# Patient Record
Sex: Female | Born: 1975 | Race: Black or African American | Hispanic: No | Smoking: Former smoker
Health system: Southern US, Community
[De-identification: ages and names within clinical notes are randomized; demographics above are authoritative.]

## PROBLEM LIST (undated history)

## (undated) DIAGNOSIS — R112 Nausea with vomiting, unspecified: Secondary | ICD-10-CM

## (undated) DIAGNOSIS — J189 Pneumonia, unspecified organism: Secondary | ICD-10-CM

## (undated) DIAGNOSIS — M1990 Unspecified osteoarthritis, unspecified site: Secondary | ICD-10-CM

## (undated) DIAGNOSIS — F419 Anxiety disorder, unspecified: Secondary | ICD-10-CM

## (undated) DIAGNOSIS — K219 Gastro-esophageal reflux disease without esophagitis: Secondary | ICD-10-CM

## (undated) DIAGNOSIS — J45909 Unspecified asthma, uncomplicated: Secondary | ICD-10-CM

## (undated) DIAGNOSIS — Z9889 Other specified postprocedural states: Secondary | ICD-10-CM

## (undated) DIAGNOSIS — M419 Scoliosis, unspecified: Secondary | ICD-10-CM

## (undated) HISTORY — PX: CHOLECYSTECTOMY: SHX55

## (undated) HISTORY — PX: APPENDECTOMY: SHX54

## (undated) HISTORY — PX: ABDOMINAL HYSTERECTOMY: SHX81

## (undated) HISTORY — PX: HERNIA REPAIR: SHX51

## (undated) HISTORY — PX: ABDOMINOPLASTY: SUR9

---

## 2011-02-03 HISTORY — PX: ABDOMINAL HYSTERECTOMY: SHX81

## 2014-10-07 ENCOUNTER — Encounter (HOSPITAL_COMMUNITY): Payer: Self-pay | Admitting: *Deleted

## 2014-10-07 ENCOUNTER — Emergency Department (HOSPITAL_COMMUNITY)
Admission: EM | Admit: 2014-10-07 | Discharge: 2014-10-07 | Disposition: A | Discharge status: Home / Self Care | Payer: Self-pay | Attending: Emergency Medicine | Admitting: Emergency Medicine

## 2014-10-07 DIAGNOSIS — Z8659 Personal history of other mental and behavioral disorders: Secondary | ICD-10-CM | POA: Insufficient documentation

## 2014-10-07 DIAGNOSIS — F419 Anxiety disorder, unspecified: Secondary | ICD-10-CM | POA: Insufficient documentation

## 2014-10-07 DIAGNOSIS — Z8739 Personal history of other diseases of the musculoskeletal system and connective tissue: Secondary | ICD-10-CM | POA: Insufficient documentation

## 2014-10-07 DIAGNOSIS — H9209 Otalgia, unspecified ear: Secondary | ICD-10-CM | POA: Insufficient documentation

## 2014-10-07 DIAGNOSIS — K029 Dental caries, unspecified: Secondary | ICD-10-CM | POA: Insufficient documentation

## 2014-10-07 DIAGNOSIS — K002 Abnormalities of size and form of teeth: Secondary | ICD-10-CM | POA: Insufficient documentation

## 2014-10-07 DIAGNOSIS — Z720 Tobacco use: Secondary | ICD-10-CM | POA: Insufficient documentation

## 2014-10-07 DIAGNOSIS — R51 Headache: Secondary | ICD-10-CM | POA: Insufficient documentation

## 2014-10-07 HISTORY — DX: Anxiety disorder, unspecified: F41.9

## 2014-10-07 HISTORY — DX: Unspecified osteoarthritis, unspecified site: M19.90

## 2014-10-07 MED ORDER — BUPIVACAINE-EPINEPHRINE (PF) 0.5% -1:200000 IJ SOLN
1.8000 mL | Freq: Once | INTRAMUSCULAR | Status: AC
  Administered 2014-10-07: 1.8 mL

## 2014-10-07 MED ORDER — PENICILLIN V POTASSIUM 500 MG PO TABS: 500.0000 mg | ORAL_TABLET | Freq: Three times a day (TID) | ORAL | Status: DC

## 2014-10-07 NOTE — ED Provider Notes (Signed)
CSN: [id number]     Arrival date & time 10/07/14  5027 History  This chart was scribed for non-physician practitioner, Abigail Butts, PA-C, working with Orlie Dakin, MD by Evelene Croon, ED Scribe. This patient was seen in room TR05C/TR05C and the patient's care was started at 9:30 AM.  Chief Complaint  Patient presents with  . Dental Pain    The history is provided by the patient. No language interpreter was used.     HPI Comments:  Sara Pratt is a 39 y.o. female who presents to the Emergency Department complaining of constant left upper dental pain for 4 days.  Pt notes the filling has come out and the tooth has chipped. She reports associated left facial pain and mild left sided facial swelling, ear pain and HA. She has taken aleve, BC powder, motrin without relief. NO fever, chills, nausea, vomiting,    Past Medical History  Diagnosis Date  . Arthritis   . Anxiety    Past Surgical History  Procedure Laterality Date  . Abdominal hysterectomy     History reviewed. No pertinent family history. Social History  Substance Use Topics  . Smoking status: Current Some Day Smoker    Types: Cigarettes  . Smokeless tobacco: Never Used  . Alcohol Use: Yes     Comment: social   OB History    No data available     Review of Systems  Constitutional: Negative for fever, chills, diaphoresis, appetite change, fatigue and unexpected weight change.  HENT: Positive for dental problem, ear pain and facial swelling. Negative for mouth sores.   Eyes: Negative for visual disturbance.  Respiratory: Negative for cough, chest tightness, shortness of breath and wheezing.   Cardiovascular: Negative for chest pain.  Gastrointestinal: Negative for nausea, vomiting, abdominal pain, diarrhea and constipation.  Endocrine: Negative for polydipsia, polyphagia and polyuria.  Genitourinary: Negative for dysuria, urgency, frequency and hematuria.  Musculoskeletal: Negative for back pain and neck  stiffness.  Skin: Negative for rash.  Allergic/Immunologic: Negative for immunocompromised state.  Neurological: Positive for headaches. Negative for syncope and light-headedness.  Hematological: Does not bruise/bleed easily.  Psychiatric/Behavioral: Negative for sleep disturbance. The patient is not nervous/anxious.       Allergies  Review of patient's allergies indicates not on file.  Home Medications   Prior to Admission medications   Medication Sig Start Date End Date Taking? Authorizing Provider  penicillin v potassium (VEETID) 500 MG tablet Take 1 tablet (500 mg total) by mouth 3 (three) times daily. 10/07/14   Daurice Ovando, PA-C   BP 144/84 mmHg  Pulse 58  Temp(Src) 98.1 F (36.7 C) (Oral)  Resp 18  Ht 5' 4.5" (1.638 m)  Wt 150 lb (68.04 kg)  BMI 25.36 kg/m2  SpO2 100% Physical Exam  Constitutional: She appears well-developed and well-nourished.  HENT:  Head: Normocephalic.  Right Ear: Tympanic membrane, external ear and ear canal normal.  Left Ear: Tympanic membrane, external ear and ear canal normal.  Nose: Nose normal. Right sinus exhibits no maxillary sinus tenderness and no frontal sinus tenderness. Left sinus exhibits no maxillary sinus tenderness and no frontal sinus tenderness.  Mouth/Throat: Uvula is midline, oropharynx is clear and moist and mucous membranes are normal. No oral lesions. Abnormal dentition. Dental caries present. No uvula swelling or lacerations. No oropharyngeal exudate, posterior oropharyngeal edema, posterior oropharyngeal erythema or tonsillar abscesses.  No gingival swelling, fluctuance or induration No gross abscess No facial swelling TTP of Tooth #14 Many other teeth of th mouth  are surgically absent  Eyes: Conjunctivae are normal. Pupils are equal, round, and reactive to light. Right eye exhibits no discharge. Left eye exhibits no discharge.  Neck: Normal range of motion. Neck supple.  No stridor Handling secretions without  difficulty No nuchal rigidity No cervical lymphadenopathy   Cardiovascular: Normal rate, regular rhythm and normal heart sounds.   Pulmonary/Chest: Effort normal. No respiratory distress.  Equal chest rise  Abdominal: Soft. Bowel sounds are normal. She exhibits no distension. There is no tenderness.  Lymphadenopathy:    She has no cervical adenopathy.  Neurological: She is alert.  Skin: Skin is warm and dry.  Psychiatric: She has a normal mood and affect.  Nursing note and vitals reviewed.   ED Course  Dental Date/Time: 10/07/2014 9:53 AM Performed by: Abigail Butts Authorized by: Abigail Butts Consent: Verbal consent obtained. Risks and benefits: risks, benefits and alternatives were discussed Consent given by: patient Patient understanding: patient states understanding of the procedure being performed Patient consent: the patient's understanding of the procedure matches consent given Procedure consent: procedure consent matches procedure scheduled Relevant documents: relevant documents present and verified Site marked: the operative site was marked Required items: required blood products, implants, devices, and special equipment available Patient identity confirmed: arm band and verbally with patient Time out: Immediately prior to procedure a "time out" was called to verify the correct patient, procedure, equipment, support staff and site/side marked as required. Preparation: Patient was prepped and draped in the usual sterile fashion. Local anesthesia used: yes Anesthesia: local infiltration Local anesthetic: bupivacaine 0.5% with epinephrine Anesthetic total: 1.8 ml Patient sedated: no Patient tolerance: Patient tolerated the procedure well with no immediate complications Comments: Dental block of tooth #14 with complete pain relief     DIAGNOSTIC STUDIES:  Oxygen Saturation is 100% on RA, normal by my interpretation.    COORDINATION OF CARE:  9:33 AM  Will perform dental block and discharge with antibiotics and referral to dentist. Discussed treatment plan with pt at bedside and pt agreed to plan.  Labs Review Labs Reviewed - No data to display  Imaging Review No results found. I have personally reviewed and evaluated these images and lab results as part of my medical decision-making.   EKG Interpretation None      MDM   Final diagnoses:  Pain due to dental caries   Prudencio Burly presents with dental pain.  No gross abscess.  Exam unconcerning for Ludwig's angina or spread of infection.  Pt given dental block in the ED.  Will treat with penicillin and recommend ibuprofen usage.  Urged patient to follow-up with dentist.     BP 144/84 mmHg  Pulse 58  Temp(Src) 98.1 F (36.7 C) (Oral)  Resp 18  Ht 5' 4.5" (1.638 m)  Wt 150 lb (68.04 kg)  BMI 25.36 kg/m2  SpO2 100%  I personally performed the services described in this documentation, which was scribed in my presence. The recorded information has been reviewed and is accurate.   Jarrett Soho Bryonna Sundby, PA-C 10/07/14 2500  Orlie Dakin, MD 10/07/14 [phone]

## 2014-10-07 NOTE — ED Notes (Signed)
Pt reports dental pain ,facial pain and swelling . Pt reports LT ear pain.

## 2014-10-07 NOTE — Discharge Instructions (Signed)
1. Medications: ibuprofen for pain, penicillin, usual home medications 2. Treatment: rest, drink plenty of fluids, take medications as prescribed 3. Follow Up: Please followup with dentistry within 1 week for discussion of your diagnoses and further evaluation after today's visit; if you do not have a primary care doctor use the resource guide provided to find one; Return to the ER for high fevers, difficulty breathing, difficulty swallowing or other concerning symptoms    Dental Caries Dental caries (also called tooth decay) is the most common oral disease. It can occur at any age but is more common in children and young adults.  HOW DENTAL CARIES DEVELOPS  The process of decay begins when bacteria and foods (particularly sugars and starches) combine in your mouth to produce plaque. Plaque is a substance that sticks to the hard, outer surface of a tooth (enamel). The bacteria in plaque produce acids that attack enamel. These acids may also attack the root surface of a tooth (cementum) if it is exposed. Repeated attacks dissolve these surfaces and create holes in the tooth (cavities). If left untreated, the acids destroy the other layers of the tooth.  RISK FACTORS  Frequent sipping of sugary beverages.   Frequent snacking on sugary and starchy foods, especially those that easily get stuck in the teeth.   Poor oral hygiene.   Dry mouth.   Substance abuse such as methamphetamine abuse.   Broken or poor-fitting dental restorations.   Eating disorders.   Gastroesophageal reflux disease (GERD).   Certain radiation treatments to the head and neck. SYMPTOMS In the early stages of dental caries, symptoms are seldom present. Sometimes white, chalky areas may be seen on the enamel or other tooth layers. In later stages, symptoms may include:  Pits and holes on the enamel.  Toothache after sweet, hot, or cold foods or drinks are consumed.  Pain around the tooth.  Swelling around  the tooth. DIAGNOSIS  Most of the time, dental caries is detected during a regular dental checkup. A diagnosis is made after a thorough medical and dental history is taken and the surfaces of your teeth are checked for signs of dental caries. Sometimes special instruments, such as lasers, are used to check for dental caries. Dental X-ray exams may be taken so that areas not visible to the eye (such as between the contact areas of the teeth) can be checked for cavities.  TREATMENT  If dental caries is in its early stages, it may be reversed with a fluoride treatment or an application of a remineralizing agent at the dental office. Thorough brushing and flossing at home is needed to aid these treatments. If it is in its later stages, treatment depends on the location and extent of tooth destruction:   If a small area of the tooth has been destroyed, the destroyed area will be removed and cavities will be filled with a material such as gold, silver amalgam, or composite resin.   If a large area of the tooth has been destroyed, the destroyed area will be removed and a cap (crown) will be fitted over the remaining tooth structure.   If the center part of the tooth (pulp) is affected, a procedure called a root canal will be needed before a filling or crown can be placed.   If most of the tooth has been destroyed, the tooth may need to be pulled (extracted). HOME CARE INSTRUCTIONS You can prevent, stop, or reverse dental caries at home by practicing good oral hygiene. Good oral  hygiene includes:  Thoroughly cleaning your teeth at least twice a day with a toothbrush and dental floss.   Using a fluoride toothpaste. A fluoride mouth rinse may also be used if recommended by your dentist or health care provider.   Restricting the amount of sugary and starchy foods and sugary liquids you consume.   Avoiding frequent snacking on these foods and sipping of these liquids.   Keeping regular visits with  a dentist for checkups and cleanings. PREVENTION   Practice good oral hygiene.  Consider a dental sealant. A dental sealant is a coating material that is applied by your dentist to the pits and grooves of teeth. The sealant prevents food from being trapped in them. It may protect the teeth for several years.  Ask about fluoride supplements if you live in a community without fluorinated water or with water that has a low fluoride content. Use fluoride supplements as directed by your dentist or health care provider.  Allow fluoride varnish applications to teeth if directed by your dentist or health care provider. Document Released: 10/11/2001 Document Revised: 06/05/2013 Document Reviewed: 01/22/2012 [facility] Patient Information 2015 [place], [place]. This information is not intended to replace advice given to you by your health care provider. Make sure you discuss any questions you have with your health care provider.    Dental Pain A tooth ache may be caused by cavities (tooth decay). Cavities expose the nerve of the tooth to air and hot or cold temperatures. It may come from an infection or abscess (also called a boil or furuncle) around your tooth. It is also often caused by dental caries (tooth decay). This causes the pain you are having. DIAGNOSIS  Your caregiver can diagnose this problem by exam. TREATMENT   If caused by an infection, it may be treated with medications which kill germs (antibiotics) and pain medications as prescribed by your caregiver. Take medications as directed.  Only take over-the-counter or prescription medicines for pain, discomfort, or fever as directed by your caregiver.  Whether the tooth ache today is caused by infection or dental disease, you should see your dentist as soon as possible for further care. SEEK MEDICAL CARE IF: The exam and treatment you received today has been provided on an emergency basis only. This is not a substitute for complete medical  or dental care. If your problem worsens or new problems (symptoms) appear, and you are unable to meet with your dentist, call or return to this location. SEEK IMMEDIATE MEDICAL CARE IF:   You have a fever.  You develop redness and swelling of your face, jaw, or neck.  You are unable to open your mouth.  You have severe pain uncontrolled by pain medicine. MAKE SURE YOU:   Understand these instructions.  Will watch your condition.  Will get help right away if you are not doing well or get worse. Document Released: 01/19/2005 Document Revised: 04/13/2011 Document Reviewed: 09/07/2007 [facility] Patient Information 2015 [place], [place]. This information is not intended to replace advice given to you by your health care provider. Make sure you discuss any questions you have with your health care provider.    Emergency Department Resource Guide 1) Find a Doctor and Pay Out of Pocket Although you won't have to find out who is covered by your insurance plan, it is a good idea to ask around and get recommendations. You will then need to call the office and see if the doctor you have chosen will accept you as a  new patient and what types of options they offer for patients who are self-pay. Some doctors offer discounts or will set up payment plans for their patients who do not have insurance, but you will need to ask so you aren't surprised when you get to your appointment.  2) Contact Your Local Health Department Not all health departments have doctors that can see patients for sick visits, but many do, so it is worth a call to see if yours does. If you don't know where your local health department is, you can check in your phone book. The CDC also has a tool to help you locate your state's health department, and many state websites also have listings of all of their local health departments.  3) Find a [facility] If your illness is not likely to be very severe or complicated, you may want to  try a walk in clinic. These are popping up all over the country in pharmacies, drugstores, and shopping centers. They're usually staffed by nurse practitioners or physician assistants that have been trained to treat common illnesses and complaints. They're usually fairly quick and inexpensive. However, if you have serious medical issues or chronic medical problems, these are probably not your best option.  No Primary Care Doctor: - Call Health Connect at  [phone] - they can help you locate a primary care doctor that  accepts your insurance, provides certain services, etc. - Physician Referral Service- [phone]  Chronic Pain Problems: Organization         Address  Phone   Notes  [facility]  [phone] Patients need to be referred by their primary care doctor.   Medication Assistance: Organization         Address  Phone   Notes  [facility] Medication [facility] [address] [place], [place] [address] [phone] --Must be a resident of [facility] -- Must have NO insurance coverage whatsoever (no Medicaid/ Medicare, etc.) -- The pt. MUST have a primary care doctor that directs their care regularly and follows them in the community   MedAssist  [phone]   [employer]  [phone]    Agencies that provide inexpensive medical care: Organization         Address  Phone   Notes  [place]  [phone]   [facility]    [phone]   [facility] [place], [place] [address] [phone]   [place] [address], [place] [phone]   Planned Parenthood    [phone]   [facility]    [phone]   [place] and [place] [address], [place] Phone:  [phone], Fax:  [phone] Hours of Operation:  9 am - 6 pm, M-F.  Also accepts Medicaid/Medicare and self-pay.  [facility] [place] for [place], [address],  Phone: [phone], Fax: [phone]. Hours of Operation:  8:30 am - 5:30 pm, M-F.  Also accepts Medicaid and self-pay.  [facility] [place] [address], [place] Phone: [phone]   [place], [place], [place] [phone], Ext. [phone] Mondays & Thursdays: 7-9 AM.  First 15 patients are seen on a first come, first serve basis.    [place] Providers:  Organization         Address  Phone   Notes  [facility] [address], [place] [phone] Also accepts self-pay patients.  [facility] [address], [place]  [phone]   [place], [address], [place] [phone]   [facility] [address], [place] [phone]   Lucianne Lei [address], [place]   [phone] Only accepts [place] Access [place] patients after they have their name applied to their card.   Self-Pay (no insurance) in [facility]:  Organization         Address  Phone   Notes  Sickle Cell Patients, [facility] [place] [phone]   [facility] [place] [phone]   [facility] [place]  [place], [place], [place] [phone]   [facility]/Dr. Osei-Bonsu  [address], [place] or [address], [place] [phone] Phone number for both [place] and [place] locations is the same.  [facility] [address] [place] [phone]   [facility] [address] [place] or [address] [phone]   [facility] [address] [place] [phone], phone; [phone], fax  Sees patients 1st and 3rd Saturday of every month.  Must not qualify for public or private insurance (i.e. Medicaid, Medicare, [place] Health Choice, Veterans' Benefits)  Household income should be no more than 200% of the poverty level The clinic cannot treat you if you are pregnant or think you are pregnant  Sexually transmitted diseases are not treated at the clinic.    Dental Care: Organization         Address  Phone  Notes  [facility] Department of [facility] [place] [phone] Accepts children up to age 57 who are enrolled in [place] or [place]; pregnant women with a Medicaid card; and children who have applied for Medicaid or [place] Health Choice, but were declined, whose parents can pay a reduced fee at time of service.  [facility] Department of [facility]  [address], [place] [phone] Accepts children up to age 23 who are enrolled in [place] or [place]; pregnant women with a Medicaid card; and children who have applied for Medicaid or [place] Health Choice, but were declined, whose parents can pay a reduced fee at time of service.  [place] Adult Dental Access PROGRAM  [place] [phone] Patients are seen by appointment only. Walk-ins are not accepted. [place] will see patients 10 years of age and older. Monday - Tuesday (8am-5pm) Most Wednesdays (8:30-5pm) $30 per visit, cash only  [facility] Adult Dental Access PROGRAM  [address], [facility] [phone] Patients are seen by appointment only. Walk-ins are not accepted. [place] will see patients 73 years of age and older. One Wednesday Evening (Monthly: Volunteer Based).  $30 per visit, cash only  [place]  [phone] for adults; Children under age 44, call Graduate Pediatric Dentistry at [phone]. Children aged 54-14, please call [phone] to  request a pediatric application.  Dental services are provided in all areas of dental care including fillings, crowns  and bridges, complete and partial dentures, implants, gum treatment, root canals, and extractions. Preventive care is also provided. Treatment is provided to both adults and children. Patients are selected via a lottery and there is often a waiting list.   [facility] [address] [place]  [phone] [email or web address]   [facility] [address] [place], [place] [phone], Ext. [phone] Second and Fourth Thursday of each month, opens at 6:30 AM; Clinic ends at 9 AM.  Patients are seen on a first-come first-served basis, and a limited number are seen during each clinic.   [facility]  [address] Hillard Danker [place], [place] [phone]   Eligibility Requirements You must have lived in [place], [place], or [place] counties for at least the last three months.   You cannot be eligible for state or federal sponsored [employer], including [employer], [place], or [employer].   You generally cannot be eligible for healthcare insurance through your employer.    How to apply: Eligibility screenings are held every Tuesday and Wednesday afternoon from 1:00 pm until 4:00 pm. You do not need an appointment for the interview!  [facility] [address] [place], [place]   [place]  [place] Department  [place]  [phone]    Behavioral Health Resources in the Community: Intensive Outpatient Programs Organization         Address  Phone  Notes  [place]. [address] [place] [place], [place] [phone]   [facility] Outpatient [address] [place], [place]   ADS: Alcohol & Drug Svcs [address] [place], [place]   [place] [address]  [place], [place] or [phone]   Substance Abuse Resources Organization         Address  Phone  Notes  Alcohol and Drug Services  [phone]   [place]  [phone]   [facility] [place]   Chinita Pester  [phone]   Residential & Outpatient Substance Abuse Program  [phone]   Psychological Services Organization         Address  Phone  Notes  [facility] [place]  [place]  [phone]   [place] [address], [place] or [phone]    Mobile Crisis Teams Organization         Address  Phone  Notes  [facility], Mobile Crisis Care Unit  [phone]   [facility] Psychotherapeutic Services  [address] [place], [place]   [place] [address], [place] [phone]    Self-Help/Support Groups Organization         Address  Phone             Notes  [place]. of [place] - variety of support groups  [place] Call for more information  Narcotics Anonymous (NA), [facility] [address], [employer]   2 meetings at this location   Special educational needs teacher         Address  Phone  Notes  [facility] [place],    [place]  [place]  [address] [place], [place], [place]   [place], [place] [phone]  Admissions: 8am-3pm M-F  Incentives Substance [place] [address],    [place], [place] [phone]   [facility] [address] [place], [place], [place]   [facility] [address],  [place], [place]   [facility] Programs - Intensive Outpatient [address] Kristeen [address], [place] of [place], [place]   [facility] ([place] [address] [place].,  [place], [place] [phone] or [phone]   [facility] Services (RTS) [address], [place], [place] Accepts Medicaid  [facility] [place] [address],  [place] [phone] Substance Abuse/Addiction Treatment   [facility] Organization         Address  Phone  Notes  [facility]  [phone]   Domenic Schwab, PhD [address] Arlis Porta [place], [place]   [phone] or [phone]   Tyrrell Peyton Ers[place]ine [place], [place] [phone]   [facility] [address] [place], [place] [phone] Insurance/Medicaid/sponsorship through [facility] and Families [address], [address] [place]                                    [place], [place] [phone] [place] [address], [place] [phone]    Dr. Adele Schilder  [phone]   [facility] [place] Dept. 1) [address], [place] 2) Cand[place]aria [place]ren[place] 3)  [address], [place] [phone]  [phone]   [place] [phone] or [phone] (After Hours)

## 2014-10-07 NOTE — ED Notes (Signed)
Declined W/C at D/C and was escorted to lobby by RN. 

## 2015-02-13 ENCOUNTER — Emergency Department (HOSPITAL_COMMUNITY)
Admission: EM | Admit: 2015-02-13 | Discharge: 2015-02-13 | Disposition: A | Discharge status: Home / Self Care | Payer: Medicaid Other | Attending: Emergency Medicine | Admitting: Emergency Medicine

## 2015-02-13 ENCOUNTER — Encounter (HOSPITAL_COMMUNITY): Payer: Self-pay | Admitting: Emergency Medicine

## 2015-02-13 DIAGNOSIS — K047 Periapical abscess without sinus: Secondary | ICD-10-CM | POA: Diagnosis not present

## 2015-02-13 DIAGNOSIS — K0889 Other specified disorders of teeth and supporting structures: Secondary | ICD-10-CM

## 2015-02-13 DIAGNOSIS — F17210 Nicotine dependence, cigarettes, uncomplicated: Secondary | ICD-10-CM | POA: Insufficient documentation

## 2015-02-13 DIAGNOSIS — Z8739 Personal history of other diseases of the musculoskeletal system and connective tissue: Secondary | ICD-10-CM | POA: Insufficient documentation

## 2015-02-13 DIAGNOSIS — Z8659 Personal history of other mental and behavioral disorders: Secondary | ICD-10-CM | POA: Insufficient documentation

## 2015-02-13 DIAGNOSIS — L739 Follicular disorder, unspecified: Secondary | ICD-10-CM

## 2015-02-13 MED ORDER — ACETAMINOPHEN 325 MG PO TABS
650.0000 mg | ORAL_TABLET | Freq: Once | ORAL | Status: AC
  Administered 2015-02-13: 650 mg via ORAL
  Filled 2015-02-13: qty 2

## 2015-02-13 MED ORDER — CLINDAMYCIN PHOSPHATE 1 % EX GEL: Freq: Two times a day (BID) | CUTANEOUS | Status: DC

## 2015-02-13 MED ORDER — NAPROXEN 500 MG PO TABS: 500.0000 mg | ORAL_TABLET | Freq: Two times a day (BID) | ORAL | Status: DC

## 2015-02-13 MED ORDER — NAPROXEN 250 MG PO TABS
500.0000 mg | ORAL_TABLET | Freq: Once | ORAL | Status: AC
  Administered 2015-02-13: 500 mg via ORAL
  Filled 2015-02-13: qty 2

## 2015-02-13 MED ORDER — PENICILLIN V POTASSIUM 500 MG PO TABS: 500.0000 mg | ORAL_TABLET | Freq: Four times a day (QID) | ORAL | Status: AC

## 2015-02-13 NOTE — ED Notes (Signed)
Patient left at this time with all belongings. 

## 2015-02-13 NOTE — ED Notes (Signed)
Supervised groin exam by Jannifer Hick

## 2015-02-13 NOTE — ED Provider Notes (Signed)
CSN: [id number]     Arrival date & time 02/13/15  0042 History   First MD Initiated Contact with Patient 02/13/15 0049     Chief Complaint  Patient presents with  . Dental Pain     (Consider location/radiation/quality/duration/timing/severity/associated sxs/prior Treatment) HPI   Sara Pratt is a 40 y.o. female with PMH significant for arthritis and anxiety who presents with 1 day hx of constant, moderate, throbbing left upper dental pain and facial swelling.  She has taken motrin with minimal relief. She also complaints of a painful, non-pruritic, non-draining bump on her left groin x 1 day. Denies fever, neck pain, SOB, choking, drooling, N/V, abdominal pain, urinary sxs, or vaginal sxs.  She does not have a Pharmacist, community.   Past Medical History  Diagnosis Date  . Arthritis   . Anxiety    Past Surgical History  Procedure Laterality Date  . Abdominal hysterectomy     No family history on file. Social History  Substance Use Topics  . Smoking status: Current Some Day Smoker    Types: Cigarettes  . Smokeless tobacco: Never Used  . Alcohol Use: Yes     Comment: social   OB History    No data available     Review of Systems All other systems negative unless otherwise stated in HPI    Allergies  Review of patient's allergies indicates no known allergies.  Home Medications   Prior to Admission medications   Medication Sig Start Date End Date Taking? Authorizing Provider  clindamycin (CLINDAGEL) 1 % gel Apply topically 2 (two) times daily. 02/13/15   Gloriann Loan, PA-C  naproxen (NAPROSYN) 500 MG tablet Take 1 tablet (500 mg total) by mouth 2 (two) times daily. 02/13/15   Gloriann Loan, PA-C  penicillin v potassium (VEETID) 500 MG tablet Take 1 tablet (500 mg total) by mouth 4 (four) times daily. 02/13/15 02/20/15  Sherrill Buikema, PA-C   BP 126/76 mmHg  Pulse 81  Temp(Src) 98.2 F (36.8 C) (Oral)  Resp 16  SpO2 99% Physical Exam  Constitutional: She is oriented to person, place,  and time. She appears well-developed and well-nourished.  HENT:  Head: Normocephalic and atraumatic.  Mouth/Throat: Uvula is midline, oropharynx is clear and moist and mucous membranes are normal. No trismus in the jaw. Abnormal dentition. Dental caries present.    No tongue swelling.  Very mild left facial swelling.  Airway patent.  Patient tolerating secretions without difficulty.  Eyes: Conjunctivae are normal.  Neck: Normal range of motion. Neck supple.  No evidence of Ludwigs angina.  Cardiovascular: Normal rate, regular rhythm and normal heart sounds.   Pulmonary/Chest: Effort normal and breath sounds normal.  Abdominal: Bowel sounds are normal. She exhibits no distension.  Genitourinary:  Single follicular pustule with mild erythema on mons pubis.  No drainage.  No induration or fluctuance.  Musculoskeletal:  Moves all extremities spontaneously.  Lymphadenopathy:    She has no cervical adenopathy.  Neurological: She is alert and oriented to person, place, and time.  Speech clear without dysarthria.   Skin: Skin is warm and dry.    ED Course  Procedures (including critical care time) Labs Review Labs Reviewed - No data to display  Imaging Review No results found. I have personally reviewed and evaluated these images and lab results as part of my medical decision-making.   EKG Interpretation None      MDM   Final diagnoses:  Pain, dental  Folliculitis    Suspect dental pain associated with  dental infection and possible dental abscess with patient afebrile, non toxic appearing and swallowing secretions well. Suspect folliculitis on mons pubis.  Will give topical clindamycin.  I gave patient referral to dentist and stressed the importance of dental follow up for ultimate management of dental pain. Discussed return precautions.  Patient expresses understanding and agrees with plan.  I will also give penicillin VK and pain control.   Filed Vitals:   02/13/15 0102    BP: 126/76  Pulse: 81  Temp: 98.2 F (36.8 C)  Resp: 16    Meds given in ED:  Medications  naproxen (NAPROSYN) tablet 500 mg (500 mg Oral Given 02/13/15 0114)  acetaminophen (TYLENOL) tablet 650 mg (650 mg Oral Given 02/13/15 0114)    New Prescriptions   CLINDAMYCIN (CLINDAGEL) 1 % GEL    Apply topically 2 (two) times daily.   NAPROXEN (NAPROSYN) 500 MG TABLET    Take 1 tablet (500 mg total) by mouth 2 (two) times daily.   PENICILLIN V POTASSIUM (VEETID) 500 MG TABLET    Take 1 tablet (500 mg total) by mouth 4 (four) times daily.       Gloriann Loan, PA-C [id number]  Delora Fuel, MD [id number]

## 2015-02-13 NOTE — ED Notes (Signed)
Pt arrives with dental pain and swelling to L side of mouth - pt unable to report when pain started but states swelling began today. Pt's speech slurred & began pointing to RLQ of abdomen and groin while discussing her pain in her mouth as if she thinks its related to that area. Not making eye contact. Hx anxiety. Last dose motrin 800 MG at 2100.

## 2015-02-13 NOTE — Discharge Instructions (Signed)
Folliculitis Folliculitis is redness, soreness, and swelling (inflammation) of the hair follicles. This condition can occur anywhere on the body. People with weakened immune systems, diabetes, or obesity have a greater risk of getting folliculitis. CAUSES  Bacterial infection. This is the most common cause.  Fungal infection.  Viral infection.  Contact with certain chemicals, especially oils and tars. Long-term folliculitis can result from bacteria that live in the nostrils. The bacteria may trigger multiple outbreaks of folliculitis over time. SYMPTOMS Folliculitis most commonly occurs on the scalp, thighs, legs, back, buttocks, and areas where hair is shaved frequently. An early sign of folliculitis is a small, white or yellow, pus-filled, itchy lesion (pustule). These lesions appear on a red, inflamed follicle. They are usually less than 0.2 inches (5 mm) wide. When there is an infection of the follicle that goes deeper, it becomes a boil or furuncle. A group of closely packed boils creates a larger lesion (carbuncle). Carbuncles tend to occur in hairy, sweaty areas of the body. DIAGNOSIS  Your caregiver can usually tell what is wrong by doing a physical exam. A sample may be taken from one of the lesions and tested in a lab. This can help determine what is causing your folliculitis. TREATMENT  Treatment may include:  Applying warm compresses to the affected areas.  Taking antibiotic medicines orally or applying them to the skin.  Draining the lesions if they contain a large amount of pus or fluid.  Laser hair removal for cases of long-lasting folliculitis. This helps to prevent regrowth of the hair. HOME CARE INSTRUCTIONS  Apply warm compresses to the affected areas as directed by your caregiver.  If antibiotics are prescribed, take them as directed. Finish them even if you start to feel better.  You may take over-the-counter medicines to relieve itching.  Do not shave irritated  skin.  Follow up with your caregiver as directed. SEEK IMMEDIATE MEDICAL CARE IF:   You have increasing redness, swelling, or pain in the affected area.  You have a fever. MAKE SURE YOU:  Understand these instructions.  Will watch your condition.  Will get help right away if you are not doing well or get worse.   This information is not intended to replace advice given to you by your health care provider. Make sure you discuss any questions you have with your health care provider.   Document Released: 03/30/2001 Document Revised: 02/09/2014 Document Reviewed: 04/21/2011 Elsevier Interactive Patient Education 2016 [place] [employer]    Dental pain may be caused by many things, including:  Tooth decay (cavities or caries). Cavities expose the nerve of your tooth to air and hot or cold temperatures. This can cause pain or discomfort.  Abscess or infection. A dental abscess is a collection of infected pus from a bacterial infection in the inner part of the tooth (pulp). It usually occurs at the end of the tooth's root.  Injury.  An unknown reason (idiopathic). Your pain may be mild or severe. It may only occur when:  You are chewing.  You are exposed to hot or cold temperature.  You are eating or drinking sugary foods or beverages, such as soda or candy. Your pain may also be constant.  HOME CARE INSTRUCTIONS  Watch your dental pain for any changes. The following actions may help to lessen any discomfort that you are feeling:  Take medicines only as directed by your dentist.  If you were prescribed an antibiotic medicine, finish all of it even if  you start to feel better.  Keep all follow-up visits as directed by your dentist. This is important.  Do not apply heat to the outside of your face.  Rinse your mouth or gargle with salt water if directed by your dentist. This helps with pain and swelling.  You can make salt water by adding  tsp of salt to 1 cup of warm  water. Apply ice to the painful area of your face:  Put ice in a plastic bag.  Place a towel between your skin and the bag.  Leave the ice on for 20 minutes, 2-3 times per day. Avoid foods or drinks that cause you pain, such as:  Very hot or very cold foods or drinks.  Sweet or sugary foods or drinks. SEEK MEDICAL CARE IF:  Your pain is not controlled with medicines.  Your symptoms are worse.  You have new symptoms. SEEK IMMEDIATE MEDICAL CARE IF:  You are unable to open your mouth.  You are having trouble breathing or swallowing.  You have a fever.  Your face, neck, or jaw is swollen. This information is not intended to replace advice given to you by your health care provider. Make sure you discuss any questions you have with your health care provider.  Document Released: 01/19/2005 Document Revised: 06/05/2014 Document Reviewed: 01/15/2014  Elsevier Interactive Patient Education 2016 [employer].    Emergency Department Resource Guide 1) Find a Doctor and Pay Out of Pocket Although you won't have to find out who is covered by your insurance plan, it is a good idea to ask around and get recommendations. You will then need to call the office and see if the doctor you have chosen will accept you as a new patient and what types of options they offer for patients who are self-pay. Some doctors offer discounts or will set up payment plans for their patients who do not have insurance, but you will need to ask so you aren't surprised when you get to your appointment.  2) Contact Your Local Health Department Not all health departments have doctors that can see patients for sick visits, but many do, so it is worth a call to see if yours does. If you don't know where your local health department is, you can check in your phone book. The CDC also has a tool to help you locate your state's health department, and many state websites also have listings of all of their local health departments.  3)  Find a [facility] If your illness is not likely to be very severe or complicated, you may want to try a walk in clinic. These are popping up all over the country in pharmacies, drugstores, and shopping centers. They're usually staffed by nurse practitioners or physician assistants that have been trained to treat common illnesses and complaints. They're usually fairly quick and inexpensive. However, if you have serious medical issues or chronic medical problems, these are probably not your best option.  No Primary Care Doctor: - Call Health Connect at  [phone] - they can help you locate a primary care doctor that  accepts your insurance, provides certain services, etc. - Physician Referral Service- [phone]  Chronic Pain Problems: Organization         Address  Phone   Notes  [facility]  [phone] Patients need to be referred by their primary care doctor.   Medication Assistance: Patent attorney  Notes  [facility] [address] [place], [place] [address] [phone] --Must be a resident of [facility] -- Must have NO insurance coverage whatsoever (no Medicaid/ Medicare, etc.) -- The pt. MUST have a primary care doctor that directs their care regularly and follows them in the community   MedAssist  [phone]   [employer]  [phone]    Agencies that provide inexpensive medical care: Organization         Address  Phone   Notes  [place]  [phone]   [facility]    [phone]   [facility] [place], [place] [address] [phone]   [place] [address], [place] [phone]   Planned Parenthood    [phone]   [facility]    [phone]   [place] and [place] [address], [place] Phone:  [phone], Fax:  [phone] Hours of Operation:  9 am - 6 pm, M-F.  Also accepts Medicaid/Medicare and self-pay.  [facility] for [place], [address], [place] Phone: [phone], Fax: [phone]. Hours of Operation:  8:30 am - 5:30 pm, M-F.  Also accepts Medicaid and self-pay.  [facility] [place] [address], [place] Phone: [phone]   [place], [place], [place] [phone], Ext. [phone] Mondays & Thursdays: 7-9 AM.  First 15 patients are seen on a first come, first serve basis.    [place] Providers:  Organization         Address  Phone   Notes  [facility] [address], [place] [phone] Also accepts self-pay patients.  [facility] [id number] [place], [place]  [phone]   [place], [address], [place] [phone]   [facility] [address], [place] [phone]   Lucianne Lei [address], [place]   [phone] Only accepts [place] Access [place] patients after they have their name applied to their card.   Self-Pay (no insurance) in [facility]:  Organization         Address  Phone   Notes  Sickle Cell Patients, [facility] [place] [phone]   [facility] [place] [phone]   [facility] [place]  [place], [place],  [phone]   [facility]/Dr. Osei-Bonsu  [address] [place] or [address], [place] [phone] Phone number for both [place] and [place] locations is the same.  [facility] [address], [place] [phone]   [facility] [address], [place] or [address] [phone]     [facility] [address] [place] [phone], phone; [phone], fax Sees patients 1st and 3rd Saturday of every month.  Must not qualify for public or private insurance (i.e. Medicaid, Medicare, [place] Health Choice, Veterans' Benefits)  Household income should be no more than 200% of the poverty level The clinic cannot treat you if you are pregnant or think you are pregnant  Sexually  transmitted diseases are not treated at the clinic.    Dental Care: Organization         Address  Phone  Notes  [facility] [place] [phone] Accepts children up to age 42 who are enrolled in [place] or [place]; pregnant women with a Medicaid card; and children who have applied for Medicaid or [place] Health Choice, but were declined, whose parents can pay a reduced fee at time of service.  [facility]  [address], [place] [phone] Accepts children up to age 57 who are enrolled in [place] or [place]; pregnant women with a Medicaid card; and children who have applied for Medicaid or [place] Health Choice, but were declined, whose parents can pay a reduced fee at time of service.  [facility]  [place] [phone] Patients are seen by appointment only. Walk-ins are not accepted. [place] will see patients 52 years of age and older. Monday - Tuesday (8am-5pm) Most Wednesdays (8:30-5pm) $30 per visit, cash only  [facility] Adult [facility] Access PRO[facility]  [address], [facility] [phone] Patients are seen by appointment only. Walk-ins are not accepted. [place] will see patients 76 years of age and older. One Wednesday Evening (Monthly: Volunteer Based).  $30 per visit, cash only  [place]  [phone] for adults; Children under age 28, call  Graduate Pediatric Dentistry at [phone]. Children aged 21-14, please call [phone] to request a pediatric application.  Dental services are provided in all areas of dental care including fillings, crowns and bridges, complete and partial dentures, implants, gum treatment, root canals, and extractions. Preventive care is also provided. Treatment is provided to both adults and children. Patients are selected via a lottery and there is often a waiting list.   [facility] [address], [place]  [phone] [email or web address]   [facility] [address] [place], [place] [phone], Ext. [phone] Second and Fourth Thursday of each month, opens at 6:30 AM; Clinic ends at 9 AM.  Patients are seen on a first-come first-served basis, and a limited number are seen during each clinic.   [facility]  [address] Hillard Danker [place], [place] [phone]   Eligibility Requirements You must have lived in [place], [place], or [place] counties for at least the last three months.   You cannot be eligible for state or federal sponsored [employer], including [employer], [place], or [employer].   You generally cannot be eligible for healthcare insurance through your employer.    How to apply: Eligibility screenings are held every Tuesday and Wednesday afternoon from 1:00 pm until 4:00 pm. You do not need an appointment for the interview!  [facility] [address] [place], [place]   [place]  [place] Department  [place]  [phone]    Behavioral Health Resources in the Community: Intensive Outpatient Programs Organization         Address  Phone  Notes  [place] McSwain. [address], [place], [place] [phone]   [facility] Outpatient [address], [place], [place]   [facility]: [facility] [address], [place], [place]  [place] [address],  [place], [place] or [phone]   Substance Abuse Resources Organization         Address  Phone  Notes  Al[facility] Drug Services  [phone]   [place]  [phone]   [facility] [place]   Chinita Pester  [phone]   Residential & Outpatient Substance Abuse Program  [phone]   Psychological Services Organization         Address  Phone  Notes  [facility] [place]  [place]  [phone]   [place] [address] [place] or [phone]    Mobile Crisis Teams Organization         Address  Phone  Notes  [facility], Mobile Crisis Care Unit  [phone]   [facility]  [address] [place], [place]   [place] Levels [address], [place] [phone]    Self-Help/Support Groups Organization         Address  Phone             Notes  [place]. of [place] - variety of support groups  [place] Call for more information  Narcotics Anonymous (NA), [facility] [address], [employer] [place]  2 meetings at this location   Special educational needs teacher         Address  Phone  Notes  [facility] [place],    [place]  [phone]   [facility]  [address], [place] [address]30092, [place], [place]   [place], [place] [phone] Admissions: 8am-3pm M-F  Incentives Substance [place] [address]    [place], [place] [phone]   [facility] [address] [place], [place], [place]   [facility] [address],  [place], [place]   [facility] - Intensive Outpatient [address] Kristeen Mans [address], [place], [place]   [facility] [address]  [place], [place] [phone] or [phone]   [facility] ([facility]) [address] [place], [place] Accepts Medicaid  [facility] [address],  [place] [phone] Substance Abuse/Addiction Treatment   [facility] Organization         Address  Phone  Notes  [facility]  [phone]   Domenic Schwab, PhD [address] Arlis [place], [place]   [phone] or [phone]   [facility] Evarts [place], [place] [phone]   [facility] [address] [place], [place] [phone] Insurance/Medicaid/sponsorship through [facility] [address]                                    [place], [place] [phone] [place] [address], [place] [phone]    Dr. Adele Schilder  [phone]   [facility] Dept. 1) [address] [place] 2) [place] 3)  [place] [address], [place] [phone]  [phone]   [place] [phone] or [phone] (  After Hours)

## 2015-10-04 ENCOUNTER — Emergency Department (HOSPITAL_COMMUNITY): Payer: Medicaid Other

## 2015-10-04 ENCOUNTER — Emergency Department (HOSPITAL_COMMUNITY)
Admission: EM | Admit: 2015-10-04 | Discharge: 2015-10-05 | Disposition: A | Discharge status: Home / Self Care | Payer: Medicaid Other | Attending: Emergency Medicine | Admitting: Emergency Medicine

## 2015-10-04 ENCOUNTER — Encounter (HOSPITAL_COMMUNITY): Payer: Self-pay | Admitting: Emergency Medicine

## 2015-10-04 ENCOUNTER — Other Ambulatory Visit: Payer: Self-pay | Admitting: Orthopaedic Surgery

## 2015-10-04 DIAGNOSIS — R1904 Left lower quadrant abdominal swelling, mass and lump: Secondary | ICD-10-CM | POA: Diagnosis not present

## 2015-10-04 DIAGNOSIS — R1032 Left lower quadrant pain: Secondary | ICD-10-CM | POA: Diagnosis present

## 2015-10-04 DIAGNOSIS — F17210 Nicotine dependence, cigarettes, uncomplicated: Secondary | ICD-10-CM | POA: Insufficient documentation

## 2015-10-04 DIAGNOSIS — Z79899 Other long term (current) drug therapy: Secondary | ICD-10-CM | POA: Diagnosis not present

## 2015-10-04 DIAGNOSIS — M4140 Neuromuscular scoliosis, site unspecified: Secondary | ICD-10-CM

## 2015-10-04 DIAGNOSIS — R1900 Intra-abdominal and pelvic swelling, mass and lump, unspecified site: Secondary | ICD-10-CM

## 2015-10-04 LAB — CBC
HEMATOCRIT: 43.7 % (ref 36.0–46.0)
Hemoglobin: 15.2 g/dL — ABNORMAL HIGH (ref 12.0–15.0)
MCH: 28.3 pg (ref 26.0–34.0)
MCHC: 34.8 g/dL (ref 30.0–36.0)
MCV: 81.4 fL (ref 78.0–100.0)
Platelets: 124 10*3/uL — ABNORMAL LOW (ref 150–400)
RBC: 5.37 MIL/uL — ABNORMAL HIGH (ref 3.87–5.11)
RDW: 13.8 % (ref 11.5–15.5)
WBC: 7.3 10*3/uL (ref 4.0–10.5)

## 2015-10-04 LAB — COMPREHENSIVE METABOLIC PANEL
ALBUMIN: 3.9 g/dL (ref 3.5–5.0)
ALT: 13 U/L — ABNORMAL LOW (ref 14–54)
ANION GAP: 7 (ref 5–15)
AST: 14 U/L — AB (ref 15–41)
Alkaline Phosphatase: 29 U/L — ABNORMAL LOW (ref 38–126)
BILIRUBIN TOTAL: 0.7 mg/dL (ref 0.3–1.2)
BUN: 10 mg/dL (ref 6–20)
CHLORIDE: 102 mmol/L (ref 101–111)
CO2: 26 mmol/L (ref 22–32)
Calcium: 9 mg/dL (ref 8.9–10.3)
Creatinine, Ser: 0.88 mg/dL (ref 0.44–1.00)
GFR calc Af Amer: >60 mL/min (ref >60)
GFR calc non Af Amer: >60 mL/min (ref >60)
GLUCOSE: 108 mg/dL — AB (ref 65–99)
POTASSIUM: 3.8 mmol/L (ref 3.5–5.1)
SODIUM: 135 mmol/L (ref 135–145)
TOTAL PROTEIN: 6.6 g/dL (ref 6.5–8.1)

## 2015-10-04 LAB — LIPASE, BLOOD: LIPASE: 22 U/L (ref 11–51)

## 2015-10-04 MED ORDER — MORPHINE SULFATE (PF) 4 MG/ML IV SOLN
4.0000 mg | Freq: Once | INTRAVENOUS | Status: AC
  Administered 2015-10-04: 4 mg via INTRAVENOUS
  Filled 2015-10-04: qty 1

## 2015-10-04 MED ORDER — ONDANSETRON HCL 4 MG/2ML IJ SOLN
4.0000 mg | Freq: Once | INTRAMUSCULAR | Status: AC
  Administered 2015-10-04: 4 mg via INTRAVENOUS
  Filled 2015-10-04: qty 2

## 2015-10-04 MED ORDER — SODIUM CHLORIDE 0.9 % IV BOLUS (SEPSIS)
1000.0000 mL | Freq: Once | INTRAVENOUS | Status: AC
  Administered 2015-10-04: 1000 mL via INTRAVENOUS

## 2015-10-04 MED ORDER — IOPAMIDOL (ISOVUE-300) INJECTION 61%
INTRAVENOUS | Status: AC
  Administered 2015-10-04: 23:00:00
  Filled 2015-10-04: qty 100

## 2015-10-04 MED ORDER — MORPHINE SULFATE (PF) 4 MG/ML IV SOLN
4.0000 mg | Freq: Once | INTRAVENOUS | Status: AC | PRN
  Administered 2015-10-05: 4 mg via INTRAVENOUS
  Filled 2015-10-04: qty 1

## 2015-10-04 NOTE — ED Triage Notes (Signed)
Pt. reports low abdominal pain and low back pain with nausea and occasional diarrhea onset today , denies fever or chills.

## 2015-10-04 NOTE — ED Notes (Signed)
Pt stated that she is unable to provide urine sample at this time. Will try again later.

## 2015-10-04 NOTE — ED Provider Notes (Signed)
[facility] DEPT Provider Note   CSN: [id number] Arrival date & time: 10/04/15  1941     History   Chief Complaint Chief Complaint  Patient presents with  . Abdominal Pain  . Back Pain    HPI Sara Pratt is a 40 y.o. female.  HPI   Patient is a 40 year old female with history of anxiety and arthritis, surgical history of abdominal hysterectomy and appendectomy, she presents emergency Department with sudden onset, severe, crampy left lower quadrant and suprapubic abdominal pain that began this morning, with radiation to the left low back and left flank. Pain is coming in waves similar to "labor pains" with increasing intensity but has not gone completely away since its onset.  She has associated nausea and diarrhea, she did not observe stool so cannot state +/- melena or hematochezia. She has no Urinary or vaginal complaints.  She has a history of abdominal hysterectomy and she believe oophorectomy because of cysts or masses, however she is unsure if she has ovaries or not.  She also had appendectomy and she states the pain is similar to when she had appendicitis.  Pain is severe and worse with palpation or movement, she gets a small amount of relief by laying curled into a fetal position on her right side.  She denies fever, chills, sweats, sick contacts, shortness of breath or chest pain.  Past Medical History:  Diagnosis Date  . Anxiety   . Arthritis     Patient Active Problem List   Diagnosis Date Noted  . Anxiety     Past Surgical History:  Procedure Laterality Date  . ABDOMINAL HYSTERECTOMY      OB History    No data available       Home Medications    Prior to Admission medications   Medication Sig Start Date End Date Taking? Authorizing Provider  hydrOXYzine (ATARAX/VISTARIL) 25 MG tablet Take 25 mg by mouth 3 (three) times daily as needed for anxiety.   Yes Historical Provider, MD  traZODone (DESYREL) 150 MG tablet Take 150 mg by mouth at bedtime.   Yes  Historical Provider, MD  clindamycin (CLINDAGEL) 1 % gel Apply topically 2 (two) times daily. Patient not taking: Reported on 10/04/2015 02/13/15   Gloriann Loan, PA-C  naproxen (NAPROSYN) 500 MG tablet Take 1 tablet (500 mg total) by mouth 2 (two) times daily. Patient not taking: Reported on 10/04/2015 02/13/15   Gloriann Loan, PA-C  ondansetron (ZOFRAN ODT) 4 MG disintegrating tablet Take 1 tablet (4 mg total) by mouth every 8 (eight) hours as needed for nausea or vomiting. 10/05/15   Delsa Grana, PA-C  oxyCODONE-acetaminophen (PERCOCET) 5-325 MG tablet Take 1-2 tablets by mouth every 8 (eight) hours as needed for severe pain. 10/05/15   Delsa Grana, PA-C    Family History No family history on file.  Social History Social History  Substance Use Topics  . Smoking status: Current Some Day Smoker    Types: Cigarettes  . Smokeless tobacco: Never Used  . Alcohol use Yes     Comment: social     Allergies   Review of patient's allergies indicates no known allergies.   Review of Systems Review of Systems  All other systems reviewed and are negative.    Physical Exam Updated Vital Signs BP 115/71   Pulse 60   Temp 98 F (36.7 C) (Oral)   Resp 16   SpO2 100%   Physical Exam  Constitutional: She is oriented to person, place, and time. Vital  signs are normal. She appears well-developed and well-nourished.  Non-toxic appearance. She does not have a sickly appearance. No distress.  Appears very uncomfortable  HENT:  Head: Normocephalic and atraumatic.  Nose: Nose normal.  Mouth/Throat: Oropharynx is clear and moist. No oropharyngeal exudate.  Eyes: Conjunctivae and EOM are normal. Pupils are equal, round, and reactive to light. Right eye exhibits no discharge. Left eye exhibits no discharge. No scleral icterus.  Neck: Normal range of motion.  Cardiovascular: Normal rate, regular rhythm, normal heart sounds and intact distal pulses.  Exam reveals no gallop and no friction rub.   No murmur  heard. Pulmonary/Chest: Effort normal and breath sounds normal. No stridor. No respiratory distress. She has no wheezes. She has no rales. She exhibits no tenderness.  Abdominal: Soft. Normal appearance and bowel sounds are normal. She exhibits no mass. There is tenderness in the suprapubic area and left lower quadrant. There is guarding and CVA tenderness. There is no rigidity and no rebound.  Left CVA tenderness  Musculoskeletal: Normal range of motion.  Neurological: She is alert and oriented to person, place, and time. She exhibits normal muscle tone. Coordination normal.  Skin: Skin is warm and dry. No rash noted. She is not diaphoretic. No erythema. No pallor.  Psychiatric: She has a normal mood and affect. Her behavior is normal. Judgment and thought content normal.  Nursing note and vitals reviewed.    ED Treatments / Results  Labs (all labs ordered are listed, but only abnormal results are displayed) Labs Reviewed  COMPREHENSIVE METABOLIC PANEL - Abnormal; Notable for the following:       Result Value   Glucose, Bld 108 (*)    AST 14 (*)    ALT 13 (*)    Alkaline Phosphatase 29 (*)    All other components within normal limits  CBC - Abnormal; Notable for the following:    RBC 5.37 (*)    Hemoglobin 15.2 (*)    Platelets 124 (*)    All other components within normal limits  URINALYSIS, ROUTINE W REFLEX MICROSCOPIC (NOT AT [facility]) - Abnormal; Notable for the following:    Specific Gravity, Urine >1.046 (*)    All other components within normal limits  LIPASE, BLOOD    EKG  EKG Interpretation None       Radiology   US Transvaginal Non-ob  Result Date: 10/05/2015 CLINICAL DATA:  Left lower quadrant pain for 1 day. Left adnexal cystic mass demonstrated on CT. EXAM: TRANSABDOMINAL AND TRANSVAGINAL ULTRASOUND OF PELVIS DOPPLER ULTRASOUND OF OVARIES TECHNIQUE: Both transabdominal and transvaginal ultrasound examinations of the pelvis were performed. Transabdominal  technique was performed for global imaging of the pelvis including uterus, ovaries, adnexal regions, and pelvic cul-de-sac. It was necessary to proceed with endovaginal exam following the transabdominal exam to visualize the ovaries. Color and duplex Doppler ultrasound was utilized to evaluate blood flow to the ovaries. COMPARISON:  CT 10/04/2015 FINDINGS: Uterus Uterus is surgically absent. Endometrium Surgically absent. Right ovary Right ovary was not visualized. Left ovary Complex cystic mass in the left adnexum measuring 9.5 x 5.8 x 7.3 cm. Cyst noted within the mass, there is a rounded solid nodular component having a central hypoechoic focus measuring 2.5 x 1.7 x 2.5 cm. This may represent the left ovary. The mass is predominate composed of fluid with thickened septations containing flow on color flow Doppler imaging. This appearance could represent a peritoneal inclusion cyst although neoplasm is not excluded. Pulsed Doppler evaluation demonstrates arterial and venous  waveforms within solid nodular component of the mass. Flow is demonstrated and solid nodular component is well is in the thickened septations on color flow Doppler imaging. Other findings A small amount of free fluid is demonstrated in the pelvis. IMPRESSION: Large complex cystic mass in the left adnexum possibly containing the ovary suspended within the mass and with thickened septations. Flow is demonstrated within thickened septations. This appearance could represent a peritoneal inclusion cyst although neoplasm should be excluded. Suggest surgical evaluation and/or additional characterization using MRI. This recommendation follows the consensus statement: Management of Asymptomatic Ovarian and Other Adnexal Cysts Imaged at [place]: Society of Radiologists in [place]. Radiology 2010; [phone]. Electronically Signed   By: Lucienne Capers M.D.   On: 10/05/2015 01:07   US Pelvis Complete  Result Date:  10/05/2015 CLINICAL DATA:  Left lower quadrant pain for 1 day. Left adnexal cystic mass demonstrated on CT. EXAM: TRANSABDOMINAL AND TRANSVAGINAL ULTRASOUND OF PELVIS DOPPLER ULTRASOUND OF OVARIES TECHNIQUE: Both transabdominal and transvaginal ultrasound examinations of the pelvis were performed. Transabdominal technique was performed for global imaging of the pelvis including uterus, ovaries, adnexal regions, and pelvic cul-de-sac. It was necessary to proceed with endovaginal exam following the transabdominal exam to visualize the ovaries. Color and duplex Doppler ultrasound was utilized to evaluate blood flow to the ovaries. COMPARISON:  CT 10/04/2015 FINDINGS: Uterus Uterus is surgically absent. Endometrium Surgically absent. Right ovary Right ovary was not visualized. Left ovary Complex cystic mass in the left adnexum measuring 9.5 x 5.8 x 7.3 cm. Cyst noted within the mass, there is a rounded solid nodular component having a central hypoechoic focus measuring 2.5 x 1.7 x 2.5 cm. This may represent the left ovary. The mass is predominate composed of fluid with thickened septations containing flow on color flow Doppler imaging. This appearance could represent a peritoneal inclusion cyst although neoplasm is not excluded. Pulsed Doppler evaluation demonstrates arterial and venous waveforms within solid nodular component of the mass. Flow is demonstrated and solid nodular component is well is in the thickened septations on color flow Doppler imaging. Other findings A small amount of free fluid is demonstrated in the pelvis. IMPRESSION: Large complex cystic mass in the left adnexum possibly containing the ovary suspended within the mass and with thickened septations. Flow is demonstrated within thickened septations. This appearance could represent a peritoneal inclusion cyst although neoplasm should be excluded. Suggest surgical evaluation and/or additional characterization using MRI. This recommendation follows the  consensus statement: Management of Asymptomatic Ovarian and Other Adnexal Cysts Imaged at [place]: Society of Radiologists in [place]. Radiology 2010; [phone]. Electronically Signed   By: Lucienne Capers M.D.   On: 10/05/2015 01:07   Ct Abdomen Pelvis W Contrast  Result Date: 10/04/2015 CLINICAL DATA:  Sudden onset left lower quadrant abdominal pain with left flank pain, nausea, and diarrhea. EXAM: CT ABDOMEN AND PELVIS WITH CONTRAST TECHNIQUE: Multidetector CT imaging of the abdomen and pelvis was performed using the standard protocol following bolus administration of intravenous contrast. CONTRAST:  100 ml ISOVUE-300 IOPAMIDOL (ISOVUE-300) INJECTION 61% COMPARISON:  None. FINDINGS: Atelectasis in the lung bases. Surgical absence of the gallbladder. No bile duct dilatation. The liver, spleen, pancreas, adrenal glands, kidneys, abdominal aorta, inferior vena cava, and retroperitoneal lymph nodes are unremarkable. Stomach, small bowel, and colon are not abnormally distended. No free air or free fluid in the abdomen. Pelvis: Uterus is surgically absent. Complex cystic structure in the left adnexum measuring 5.2 x 7.4 x 7.8 cm.  There is thick septation and focal nodularity with central lucency. Small amount of fluid or stranding around cystic structure. Small amount of free fluid in the pelvis. This could represent hemorrhagic cyst, infected cyst, or endometriosis. Cystic neoplasm is not excluded. Suggest ultrasound for further characterization. Bladder wall is not thickened. No pelvic lymphadenopathy. No destructive bone lesions. IMPRESSION: 7.8 cm max diameter complex indeterminate cystic structure in the left adnexum. Ultrasound suggested for further characterization. Small amount of free fluid in the pelvis is likely reactive. Electronically Signed   By: Lucienne Capers M.D.   On: 10/04/2015 23:19   [place] Art/ven Flow Abd Pelv Doppler  Result Date: 10/05/2015 CLINICAL DATA:   Left lower quadrant pain for 1 day. Left adnexal cystic mass demonstrated on CT. EXAM: TRANSABDOMINAL AND TRANSVAGINAL ULTRASOUND OF PELVIS DOPPLER ULTRASOUND OF OVARIES TECHNIQUE: Both transabdominal and transvaginal ultrasound examinations of the pelvis were performed. Transabdominal technique was performed for global imaging of the pelvis including uterus, ovaries, adnexal regions, and pelvic cul-de-sac. It was necessary to proceed with endovaginal exam following the transabdominal exam to visualize the ovaries. Color and duplex Doppler ultrasound was utilized to evaluate blood flow to the ovaries. COMPARISON:  CT 10/04/2015 FINDINGS: Uterus Uterus is surgically absent. Endometrium Surgically absent. Right ovary Right ovary was not visualized. Left ovary Complex cystic mass in the left adnexum measuring 9.5 x 5.8 x 7.3 cm. Cyst noted within the mass, there is a rounded solid nodular component having a central hypoechoic focus measuring 2.5 x 1.7 x 2.5 cm. This may represent the left ovary. The mass is predominate composed of fluid with thickened septations containing flow on color flow Doppler imaging. This appearance could represent a peritoneal inclusion cyst although neoplasm is not excluded. Pulsed Doppler evaluation demonstrates arterial and venous waveforms within solid nodular component of the mass. Flow is demonstrated and solid nodular component is well is in the thickened septations on color flow Doppler imaging. Other findings A small amount of free fluid is demonstrated in the pelvis. IMPRESSION: Large complex cystic mass in the left adnexum possibly containing the ovary suspended within the mass and with thickened septations. Flow is demonstrated within thickened septations. This appearance could represent a peritoneal inclusion cyst although neoplasm should be excluded. Suggest surgical evaluation and/or additional characterization using MRI. This recommendation follows the consensus statement:  Management of Asymptomatic Ovarian and Other Adnexal Cysts Imaged at [place]: Society of Radiologists in [place]. Radiology 2010; [phone]. Electronically Signed   By: Lucienne Capers M.D.   On: 10/05/2015 01:07    No results found.  Procedures Procedures (including critical care time)  Medications Ordered in ED Medications  sodium chloride 0.9 % bolus 1,000 mL (0 mLs Intravenous Stopped 10/05/15 0004)  morphine 4 MG/ML injection 4 mg (4 mg Intravenous Given 10/04/15 2235)  ondansetron (ZOFRAN) injection 4 mg (4 mg Intravenous Given 10/04/15 2235)  iopamidol (ISOVUE-300) 61 % injection (  Contrast Given 10/04/15 2245)  morphine 4 MG/ML injection 4 mg (4 mg Intravenous Given 10/05/15 0132)  ondansetron (ZOFRAN) injection 4 mg (4 mg Intravenous Given 10/05/15 0229)  oxyCODONE-acetaminophen (PERCOCET/ROXICET) 5-325 MG per tablet 2 tablet (2 tablets Oral Given 10/05/15 0229)     Initial Impression / Assessment and Plan / ED Course  I have reviewed the triage vital signs and the nursing notes.  Pertinent labs & imaging results that were available during my care of the patient were reviewed by me and considered in my medical decision making (see chart for  details).  Clinical Course   LLQ and suprapubic pain with radiation to left low back and flank, sudden onset this am, constant and severe.   Pt had diarrhea and nausea.  No urinary or vaginal complaints.  Is s/p appendectomy and abdominal hysterectomy, she thinks she had ovaries removed as well, but is unsure and records are not available.   On exam she appears very uncomfortable, afebrile, ttp in suprapubic, left lower quadrant and left CVA tenderness.  No vomiting.  CT abd/pelvis to eval because it may be nephrolithiasis, diverticulitis or GU etiology.  CT scan was pertinent for a 7.8 cm diameter complex indeterminate cystic structure in left adnexum, radiologist suggested ultrasound evaluation.  Pelvic ultrasound with  art/ven Doppler ordered.  May be ovarian cyst, [place] to eval torsion.  Pelvis US pertinent for large cystic mass in left pelvis with suspected ovary suspended in it, ovary with observed blood flow.   Consulted Dr. Ihor Dow who will see pt in clinic, states pt can d/c home if pain is controlled.  Plan was discussed with pt, all questions answered, return precautions reviewed.  Case and plan was also discussed with present EDP, Dr. Tamera Punt, who also agrees with plan.  Pt handed off to night PA, Verdene Rio, PA-C, with UA pending, will dispo to home after resulted  Final Clinical Impressions(s) / ED Diagnoses   Final diagnoses:  LLQ pain  Pelvic mass in female    New Prescriptions Discharge Medication List as of 10/05/2015  2:03 AM    START taking these medications   Details  ondansetron (ZOFRAN ODT) 4 MG disintegrating tablet Take 1 tablet (4 mg total) by mouth every 8 (eight) hours as needed for nausea or vomiting., Starting Sat 10/05/2015, Print    oxyCODONE-acetaminophen (PERCOCET) 5-325 MG tablet Take 1-2 tablets by mouth every 8 (eight) hours as needed for severe pain., Starting Sat 10/05/2015, Print         Delsa Grana, PA-C 10/07/15 0206    Elnora Morrison, MD 10/11/15 [phone]

## 2015-10-05 ENCOUNTER — Emergency Department (HOSPITAL_COMMUNITY): Payer: Medicaid Other

## 2015-10-05 LAB — URINALYSIS, ROUTINE W REFLEX MICROSCOPIC
Bilirubin Urine: NEGATIVE
GLUCOSE, UA: NEGATIVE mg/dL
HGB URINE DIPSTICK: NEGATIVE
Ketones, ur: NEGATIVE mg/dL
LEUKOCYTES UA: NEGATIVE
Nitrite: NEGATIVE
PH: 6 (ref 5.0–8.0)
Protein, ur: NEGATIVE mg/dL
Specific Gravity, Urine: >1.046 — ABNORMAL HIGH (ref 1.005–1.030)

## 2015-10-05 MED ORDER — OXYCODONE-ACETAMINOPHEN 5-325 MG PO TABS
2.0000 | ORAL_TABLET | Freq: Once | ORAL | Status: AC
  Administered 2015-10-05: 2 via ORAL
  Filled 2015-10-05: qty 2

## 2015-10-05 MED ORDER — ONDANSETRON HCL 4 MG/2ML IJ SOLN
4.0000 mg | Freq: Once | INTRAMUSCULAR | Status: AC
  Administered 2015-10-05: 4 mg via INTRAVENOUS
  Filled 2015-10-05: qty 2

## 2015-10-05 MED ORDER — SODIUM CHLORIDE 0.9 % IV BOLUS (SEPSIS): 500.0000 mL | Freq: Once | INTRAVENOUS | Status: DC

## 2015-10-05 MED ORDER — OXYCODONE-ACETAMINOPHEN 5-325 MG PO TABS: 1.0000 | ORAL_TABLET | Freq: Three times a day (TID) | ORAL | 0 refills | Status: DC | PRN

## 2015-10-05 MED ORDER — ONDANSETRON 4 MG PO TBDP: 4.0000 mg | ORAL_TABLET | Freq: Three times a day (TID) | ORAL | 0 refills | Status: DC | PRN

## 2015-10-05 NOTE — ED Notes (Signed)
Pt verbalized understanding of d/c instructions and has no further questions. Pt stable and NAD. Pt d/c home with family driving.  

## 2015-10-05 NOTE — ED Notes (Signed)
Pt transported to US

## 2015-10-10 ENCOUNTER — Ambulatory Visit: Payer: Medicaid Other | Admitting: Physical Therapy

## 2015-10-11 ENCOUNTER — Ambulatory Visit: Payer: Medicaid Other | Admitting: Physical Therapy

## 2015-10-11 ENCOUNTER — Encounter: Payer: Self-pay | Admitting: Physical Therapy

## 2015-10-11 NOTE — Therapy (Signed)
[facility] [place], [place], [address] Phone: [phone]   Fax:  [phone]  Patient Details  Name: Sara Pratt MRN: [id number] Date of Birth: [birth year] Referring Provider:  No ref. provider found  Encounter Date: 10/11/2015  Pt arrived for therapy evaluation today c/o L hip and low back pain with radiation. Pt reported she is scheduled for an MRI on 10/15/15 to rule out abdominal mass. Given pt has Medicaid and will only receive one paid therapy visit and is unable to pay out of pocket. I recommended that pt return following her MRI results to better serve the needs of the patient.            Oretha Caprice 10/11/2015, 10:05 AM  [facility] [address] [place], [place], [address] Phone: [phone]   Fax:  [phone]   Kearney Hard, PT 10/11/15 10:05 AM

## 2015-10-15 ENCOUNTER — Ambulatory Visit
Admission: RE | Admit: 2015-10-15 | Discharge: 2015-10-15 | Disposition: A | Discharge status: Home / Self Care | Payer: Medicaid Other | Source: Ambulatory Visit | Attending: Orthopaedic Surgery | Admitting: Orthopaedic Surgery

## 2015-10-15 DIAGNOSIS — M4140 Neuromuscular scoliosis, site unspecified: Secondary | ICD-10-CM

## 2015-10-21 ENCOUNTER — Ambulatory Visit: Payer: Medicaid Other | Attending: Orthopaedic Surgery | Admitting: Physical Therapy

## 2015-10-23 ENCOUNTER — Encounter: Payer: Self-pay | Admitting: Obstetrics and Gynecology

## 2015-10-23 ENCOUNTER — Ambulatory Visit (INDEPENDENT_AMBULATORY_CARE_PROVIDER_SITE_OTHER): Payer: Medicaid Other | Admitting: Obstetrics and Gynecology

## 2015-10-23 VITALS — BP 120/82 | HR 68 | Wt 153.1 lb

## 2015-10-23 DIAGNOSIS — N9489 Other specified conditions associated with female genital organs and menstrual cycle: Secondary | ICD-10-CM

## 2015-10-23 DIAGNOSIS — N949 Unspecified condition associated with female genital organs and menstrual cycle: Secondary | ICD-10-CM

## 2015-10-23 NOTE — Progress Notes (Signed)
40 yo here as ED follow up for the management of a left adnexal mass. Patient reports that since her ED visit on 10/05/2015 her pain has persisted but the severity is not the same. She describes her pain as Nesanel Aguila cramping pain in the left lower part of her abdomen and occasionally the intensity of her pain increases and travels to her back, making it hard to walk. Her pain is well controlled with pain medication during the intense episodes. Patient denies any other complaints. She denies dysuria, or abnormal vaginal discharge/bleeding. Patient underwent a hysterectomy 3 years ago at the time of a repeat cesarean section secondary to uterine rupture. Her daughter did not survive. She is still grieving over this and feels that her care was mismanaged. She is very skeptical of medical providers and very anxious about this recent findings  Past Medical History:  Diagnosis Date  . Anxiety   . Arthritis    Past Surgical History:  Procedure Laterality Date  . ABDOMINAL HYSTERECTOMY    Cesarean section  Abdominal plasty  No family history on file.  Mother with brain cancer  Social History  Substance Use Topics  . Smoking status: Current Some Day Smoker    Types: Cigarettes  . Smokeless tobacco: Never Used  . Alcohol use Yes     Comment: social   ROS See pertinent in HPI  Blood pressure 120/82, pulse 68, weight 153 lb 1.6 oz (69.4 kg). GENERAL: Well-developed, well-nourished female in no acute distress.  HEENT: Normocephalic, atraumatic. Sclerae anicteric.  ABDOMEN: Soft, nontender, nondistended. No organomegaly. PELVIC: Normal external female genitalia. Vagina is pink and rugated.  Positive fullness in left adnexal region, appears to be fixed to the sidewall, no tenderness on exam. EXTREMITIES: No cyanosis, clubbing, or edema, 2+ distal pulses.  10/05/2015 ultrasound FINDINGS: Uterus  Uterus is surgically absent.  Endometrium  Surgically absent.  Right ovary  Right ovary was  not visualized.  Left ovary  Complex cystic mass in the left adnexum measuring 9.5 x 5.8 x 7.3 cm. Cyst noted within the mass, there is a rounded solid nodular component having a central hypoechoic focus measuring 2.5 x 1.7 x 2.5 cm. This may represent the left ovary. The mass is predominate composed of fluid with thickened septations containing flow on color flow Doppler imaging. This appearance could represent a peritoneal inclusion cyst although neoplasm is not excluded.  Pulsed Doppler evaluation demonstrates arterial and venous waveforms within solid nodular component of the mass. Flow is demonstrated and solid nodular component is well is in the thickened septations on color flow Doppler imaging.  Other findings  A small amount of free fluid is demonstrated in the pelvis.  IMPRESSION: Large complex cystic mass in the left adnexum possibly containing the ovary suspended within the mass and with thickened septations. Flow is demonstrated within thickened septations. This appearance could represent a peritoneal inclusion cyst although neoplasm should be excluded. Suggest surgical evaluation and/or additional characterization using MRI. This recommendation follows the consensus statement: Management of Asymptomatic Ovarian and Other Adnexal Cysts Imaged at [place]: Society of Radiologists in [place]. Radiology 2010; [phone].   Electronically Signed   By: Lucienne Capers M.D.   On: 10/05/2015 01:07  A/P 40 yo with a large left adnexal mass - MRI pelvis order in preparation for surgical intervention - Op report ordered regarding c-hyst as patient is uncertain as to whether her right ovary was removed - Patient would like to keep her ovarie as she feels  it is "the last thing that makes me a woman" - Patient is very anxious and would like surgical intervention ASAP. She does not have a provider preference if it means that the surgery  can be done sooner. - RTC post MRI to discuss results and further management care - A total of 40 minutes was spent counseling this patient

## 2015-10-30 ENCOUNTER — Ambulatory Visit (HOSPITAL_COMMUNITY)
Admission: RE | Admit: 2015-10-30 | Discharge: 2015-10-30 | Disposition: A | Discharge status: Home / Self Care | Payer: Medicaid Other | Source: Ambulatory Visit | Attending: Obstetrics and Gynecology | Admitting: Obstetrics and Gynecology

## 2015-10-30 DIAGNOSIS — N949 Unspecified condition associated with female genital organs and menstrual cycle: Secondary | ICD-10-CM | POA: Diagnosis present

## 2015-10-30 DIAGNOSIS — N9489 Other specified conditions associated with female genital organs and menstrual cycle: Secondary | ICD-10-CM | POA: Insufficient documentation

## 2015-10-30 MED ORDER — GADOBENATE DIMEGLUMINE 529 MG/ML IV SOLN
10.0000 mL | Freq: Once | INTRAVENOUS | Status: AC | PRN
  Administered 2015-10-30: 10 mL via INTRAVENOUS

## 2015-11-04 ENCOUNTER — Ambulatory Visit (INDEPENDENT_AMBULATORY_CARE_PROVIDER_SITE_OTHER): Payer: Medicaid Other | Admitting: Obstetrics & Gynecology

## 2015-11-04 VITALS — BP 133/69 | HR 71 | Wt 154.9 lb

## 2015-11-04 DIAGNOSIS — N949 Unspecified condition associated with female genital organs and menstrual cycle: Secondary | ICD-10-CM

## 2015-11-04 DIAGNOSIS — N9489 Other specified conditions associated with female genital organs and menstrual cycle: Secondary | ICD-10-CM

## 2015-11-04 MED ORDER — OXYCODONE-ACETAMINOPHEN 5-325 MG PO TABS: 1.0000 | ORAL_TABLET | Freq: Three times a day (TID) | ORAL | 0 refills | Status: DC | PRN

## 2015-11-04 MED ORDER — NAPROXEN 500 MG PO TABS: 500.0000 mg | ORAL_TABLET | Freq: Two times a day (BID) | ORAL | 2 refills | Status: DC

## 2015-11-04 MED ORDER — ONDANSETRON 4 MG PO TBDP: 4.0000 mg | ORAL_TABLET | Freq: Three times a day (TID) | ORAL | 1 refills | Status: DC | PRN

## 2015-11-04 NOTE — Progress Notes (Signed)
GYNECOLOGY VISIT NOTE  History:  40 y.o. F here today for follow up of MRI results; imaging obtained fror further characterization of adnexal mass. Still reports occasional pain and nausea; desires refills of medications. She is adamant she does not want to become menopausal. She denies any abnormal vaginal discharge, bleeding or other concerns.   Past Medical History:  Diagnosis Date  . Anxiety   . Arthritis     Past Surgical History:  Procedure Laterality Date  . ABDOMINAL HYSTERECTOMY     The following portions of the patient's history were reviewed and updated as appropriate: allergies, current medications, past family history, past medical history, past social history, past surgical history and problem list.    Review of Systems:  Pertinent items noted in HPI and remainder of comprehensive ROS otherwise negative.  Objective:  Physical Exam BP 133/69   Pulse 71   Wt 154 lb 14.4 oz (70.3 kg)   BMI 26.18 kg/m  CONSTITUTIONAL: Well-developed, well-nourished female in no acute distress.  HENT:  Normocephalic, atraumatic. External right and left ear normal. Oropharynx is clear and moist EYES: Conjunctivae and EOM are normal. Pupils are equal, round, and reactive to light. No scleral icterus.  NECK: Normal range of motion, supple, no masses SKIN: Skin is warm and dry. No rash noted. Not diaphoretic. No erythema. No pallor. NEUROLOGIC: Alert and oriented to person, place, and time. Normal reflexes, muscle tone coordination. No cranial nerve deficit noted. PSYCHIATRIC: Normal mood and affect. Normal behavior. Normal judgment and thought content. CARDIOVASCULAR: Normal heart rate noted RESPIRATORY: Effort and breath sounds normal, no problems with respiration noted ABDOMEN: Soft, no distention noted.   PELVIC: Deferred MUSCULOSKELETAL: Normal range of motion. No edema noted.  Labs and Imaging Mr Lumbar Spine Wo Contrast  Result Date: 10/15/2015 CLINICAL DATA:  Initial  valuation for intermittent mid and low back pain with left hip pain for 3 years. EXAM: MRI LUMBAR SPINE WITHOUT CONTRAST TECHNIQUE: Multiplanar, multisequence MR imaging of the lumbar spine was performed. No intravenous contrast was administered. COMPARISON:  None. FINDINGS: Segmentation: Normal segmentation. Lowest well-formed disc is labeled the L5-S1 level. Alignment: Mild levoscoliosis is present. Vertebral bodies otherwise normally aligned with preservation of the normal lumbar lordosis. No listhesis. Vertebrae: Vertebral body heights well preserved. No evidence for acute or chronic fracture. Signal intensity within the vertebral body bone marrow within normal limits. No marrow edema. Conus medullaris: Extends to the T12-L1 level and appears normal. Paraspinal and other soft tissues: Paraspinous soft tissues are normal in appearance. Disc levels: No significant degenerative disc disease identified within the lumbar spine. Intervertebral discs height is well preserved. This themselves are well hydrated. No significant disc bulge. No focal disc protrusions identified. No significant canal or foraminal stenosis. No significant facet arthropathy. IMPRESSION: 1. Levoscoliosis. 2. Otherwise negative MRI of the lumbar spine. No significant degenerative changes identified. No focal disc protrusions. No significant stenosis or evidence for neural impingement. Electronically Signed   By: Jeannine Boga M.D.   On: 10/15/2015 21:17   Mr Pelvis W Wo Contrast  Result Date: 10/30/2015 CLINICAL DATA:  Evaluate cystic mass left side of pelvis EXAM: MRI PELVIS WITHOUT CONTRAST TECHNIQUE: Multiplanar multisequence MR imaging of the pelvis was performed. No intravenous contrast was administered. COMPARISON:  10/04/2015 FINDINGS: Uterus:  Previous hysterectomy Endometrium:  Not applicable. Cervix/Vagina:  Appears normal. Right ovary: Appears normal. No mass identified. Several prominent follicles are noted. The largest  measures 1.5 cm. Left ovary: The left ovary is not visualized.  There is a cystic mass containing internal septation and small mural nodule identified. This measures 5.9 x 3.4 x 7.3 cm. Eccentric enhancing mural nodule is identified measuring 1.2 cm, image 13 of series 10. There is a single internal area of septation which also enhances, image number 16 of series 15. Urinary Tract:  Bladder appears normal. Bowel:  Unremarkable visualized pelvic bowel loops. Vascular/Lymphatic: No pathologically enlarged lymph nodes. No significant vascular abnormality seen. Other: There is a small amount of free fluid noted within the dependent portion of the pelvis. Musculoskeletal:  Unremarkable. IMPRESSION: 1. Cystic mass within the left adnexa is identified and is indeterminate. This contains at least one area of internal enhancing septation and a small mural nodule which also enhances. Cannot rule out benign or malignant cystic ovarian neoplasm. Surgical consultation advised. Electronically Signed   By: Kerby Moors M.D.   On: 10/30/2015 18:36    Assessment & Plan:  1. Adnexal mass Reviewed imaging results with patient. Recommended checking CA-125, that will be done today. Also recommended surgical management, informed her that her right ovary will be left alone and every effort will be made not to leave her menopausal. However, if the right ovary was noted to be involved at time of surgery, there may be no choice but to remove it.  Patient is very distrustful of this whole process, but wants to proceed with surgery. Recommended laparoscopic unilateral salpingo-oophorectomy.  The risks of surgery were discussed in detail with the patient including but not limited to: bleeding which may require transfusion or reoperation; infection which may require prolonged hospitalization or re-hospitalization and antibiotic therapy; injury to bowel, bladder, ureters and major vessels or other surrounding organs; need for additional  procedures including laparotomy; thromboembolic phenomenon, incisional problems and other postoperative or anesthesia complications.  Patient was told that the likelihood that her condition and symptoms will be treated effectively with this surgical management was very high; the postoperative expectations were also discussed in detail. The patient also understands the alternative treatment options which were discussed in full. All questions were answered.  She was told that she will be contacted by our surgical scheduler regarding the time and date of her surgery; routine preoperative instructions of having nothing to eat or drink after midnight on the day prior to surgery and also coming to the hospital 1.5 hours prior to her time of surgery were also emphasized.  She was told she may be called for a preoperative appointment about a week prior to surgery and will be given further preoperative instructions at that visit. Printed patient education handouts about the procedure were given to the patient to review at home.  Patient was given Naproxen, Percocet and Zofran as needed for symptoms.  Surgery will be scheduled with either myself or Dr. Elly Modena; patient wants it done as soon as possible.  - ondansetron (ZOFRAN ODT) 4 MG disintegrating tablet; Take 1 tablet (4 mg total) by mouth every 8 (eight) hours as needed for nausea or vomiting.  Dispense: 30 tablet; Refill: 1 - oxyCODONE-acetaminophen (PERCOCET) 5-325 MG tablet; Take 1-2 tablets by mouth every 8 (eight) hours as needed for severe pain.  Dispense: 20 tablet; Refill: 0 - naproxen (NAPROSYN) 500 MG tablet; Take 1 tablet (500 mg total) by mouth 2 (two) times daily with a meal. As needed for pain  Dispense: 60 tablet; Refill: 2 - CA 125 Please refer to After Visit Summary for other counseling recommendations.    Total face-to-face time with patient: 25 minutes. Over  50% of encounter was spent on counseling and coordination of care.   Verita Schneiders, MD, [facility] Attending [place], [facility] for [employer], [place]

## 2015-11-04 NOTE — Patient Instructions (Addendum)
Thank you for enrolling in [facility]. Please follow the instructions below to securely access your online medical record. MyChart allows you to send messages to your doctor, view your test results, manage appointments, and more.   How Do I Sign Up? 1. In your Internet browser, go to [employer] and enter [email or web address]. 2. Click on the Sign Up Now link in the Sign In box. You will see the New Member Sign Up page. 3. Enter your MyChart Access Code exactly as it appears below. You will not need to use this code after you've completed the sign-up process. If you do not sign up before the expiration date, you must request a new code.  MyChart Access Code: [email or web address] Expires: 12/04/2015  2:03 AM  4. Enter your Social Security Number ([id number]) and Date of Birth (mm/dd/yyyy) as indicated and click Submit. You will be taken to the next sign-up page. 5. Create a MyChart ID. This will be your MyChart login ID and cannot be changed, so think of one that is secure and easy to remember. 6. Create a MyChart password. You can change your password at any time. 7. Enter your Password Reset Question and Answer. This can be used at a later time if you forget your password.  8. Enter your e-mail address. You will receive e-mail notification when new information is available in [place]. 9. Click Sign Up. You can now view your medical record.   Additional Information Remember, MyChart is NOT to be used for urgent needs. For medical emergencies, dial 911.      Unilateral Salpingo-Oophorectomy Unilateral salpingo-oophorectomy is the surgical removal of one fallopian tube and ovary. The ovaries are small organs that produce eggs in women. The fallopian tubes transport the egg from the ovary to the womb (uterus). A unilateral salpingo-oophorectomy may be done for various reasons, including:  Infection in the fallopian tube and ovary.  Scar tissue in the fallopian tube and ovary  (adhesions).  A cyst or tumor on the ovary.  A need to remove the fallopian tube and ovary when removing the uterus.  Cancer of the fallopian tube or ovary. The removal of one fallopian tube and ovary will not prevent you from becoming pregnant, put you into menopause, or cause problems with your menstrual periods or sex drive. LET [facility] CARE PROVIDER KNOW ABOUT:  Any allergies you have.  All medicines you are taking, including vitamins, herbs, eye drops, creams, and over-the-counter medicines.  Previous problems you or members of your family have had with the use of anesthetics.  Any blood disorders you have.  Previous surgeries you have had.  Medical conditions you have. RISKS AND COMPLICATIONS  Generally, this is a safe procedure. However, as with any procedure, complications can occur. Possible complications include:  Injury to surrounding organs.  Bleeding.  Infection.  Blood clots in the legs or lungs.  Problems related to anesthesia. BEFORE THE PROCEDURE  Ask your health care provider about changing or stopping your regular medicines. You may need to stop taking certain medicines, such as aspirin or blood thinners, at least 1 week before the surgery.  Do not eat or drink anything for at least 8 hours before the surgery.  If you smoke, do not smoke for at least 2 weeks before the surgery.  Make plans to have someone drive you home after the procedure or after your hospital stay. Also arrange for someone to help you with activities during recovery. PROCEDURE  You will be given medicine to  help you relax before the procedure (sedative). You will then be given medicine to make you sleep through the procedure (general anesthetic). These medicines will be given through an IV access tube that is put into one of your veins.  Once you are asleep, your lower abdomen will be shaved and cleaned. A thin, flexible tube (catheter) will be placed in your bladder.  The  surgeon may use a laparoscopic, robotic, or open technique for this surgery:  In the laparoscopic technique, the surgery is done through two small cuts (incisions) in the abdomen. A thin, lighted tube with a tiny camera on the end (laparoscope) is inserted into one of the incisions. The tools needed for the procedure are put through the other incision.  A robotic technique may be chosen to perform complex surgery in a small space. In the robotic technique, small incisions are made. A camera and surgical instruments are passed through the incisions. Surgical instruments are controlled with the help of a robotic arm.  In the open technique, the surgery is done through one large incision in the abdomen.  Using any of these techniques, the surgeon will remove the fallopian tube and ovary. The blood vessels will be clamped and tied.  The surgeon will then use staples or stitches to close the incision or incisions. AFTER THE PROCEDURE  You will be taken to a recovery area where your progress will be monitored for 1-3 hours. Your blood pressure, pulse, and temperature will be checked often. You will remain in the recovery area until you are stable and waking up.  If the laparoscopic technique was used, you may be allowed to go home after several hours. You may have some shoulder pain. This is normal and usually goes away in a day or two.  If the open technique was used, you will be admitted to the hospital for a couple of days.  You will be given pain medicine as necessary.  The IV tube and catheter will be removed before you are discharged.   This information is not intended to replace advice given to you by your health care provider. Make sure you discuss any questions you have with your health care provider.   Document Released: 11/16/2008 Document Revised: 01/24/2013 Document Reviewed: 07/13/2012 Elsevier Interactive Patient Education [employer].

## 2015-11-05 LAB — CA 125: CA 125: 7 U/mL (ref <35)

## 2015-11-08 ENCOUNTER — Encounter (HOSPITAL_COMMUNITY): Payer: Self-pay | Admitting: *Deleted

## 2015-11-11 ENCOUNTER — Other Ambulatory Visit: Payer: Self-pay | Admitting: Obstetrics and Gynecology

## 2015-11-11 ENCOUNTER — Telehealth: Payer: Self-pay | Admitting: *Deleted

## 2015-11-11 ENCOUNTER — Encounter (HOSPITAL_COMMUNITY)
Admission: RE | Admit: 2015-11-11 | Discharge: 2015-11-11 | Disposition: A | Discharge status: Home / Self Care | Payer: Medicaid Other | Source: Ambulatory Visit | Attending: Obstetrics and Gynecology | Admitting: Obstetrics and Gynecology

## 2015-11-11 ENCOUNTER — Encounter (HOSPITAL_COMMUNITY): Payer: Self-pay

## 2015-11-11 DIAGNOSIS — R102 Pelvic and perineal pain: Secondary | ICD-10-CM | POA: Diagnosis not present

## 2015-11-11 DIAGNOSIS — Z01818 Encounter for other preprocedural examination: Secondary | ICD-10-CM | POA: Diagnosis present

## 2015-11-11 DIAGNOSIS — K219 Gastro-esophageal reflux disease without esophagitis: Secondary | ICD-10-CM | POA: Insufficient documentation

## 2015-11-11 DIAGNOSIS — J45909 Unspecified asthma, uncomplicated: Secondary | ICD-10-CM | POA: Insufficient documentation

## 2015-11-11 DIAGNOSIS — R1900 Intra-abdominal and pelvic swelling, mass and lump, unspecified site: Secondary | ICD-10-CM | POA: Diagnosis not present

## 2015-11-11 DIAGNOSIS — F17210 Nicotine dependence, cigarettes, uncomplicated: Secondary | ICD-10-CM | POA: Diagnosis not present

## 2015-11-11 DIAGNOSIS — Z79899 Other long term (current) drug therapy: Secondary | ICD-10-CM | POA: Insufficient documentation

## 2015-11-11 HISTORY — DX: Gastro-esophageal reflux disease without esophagitis: K21.9

## 2015-11-11 HISTORY — DX: Pneumonia, unspecified organism: J18.9

## 2015-11-11 HISTORY — DX: Unspecified asthma, uncomplicated: J45.909

## 2015-11-11 HISTORY — DX: Other specified postprocedural states: Z98.890

## 2015-11-11 HISTORY — DX: Scoliosis, unspecified: M41.9

## 2015-11-11 HISTORY — DX: Nausea with vomiting, unspecified: R11.2

## 2015-11-11 LAB — CBC
HEMATOCRIT: 37.8 % (ref 36.0–46.0)
HEMOGLOBIN: 13.8 g/dL (ref 12.0–15.0)
MCH: 28.9 pg (ref 26.0–34.0)
MCHC: 36.5 g/dL — AB (ref 30.0–36.0)
MCV: 79.1 fL (ref 78.0–100.0)
Platelets: 135 10*3/uL — ABNORMAL LOW (ref 150–400)
RBC: 4.78 MIL/uL (ref 3.87–5.11)
RDW: 14.2 % (ref 11.5–15.5)
WBC: 6 10*3/uL (ref 4.0–10.5)

## 2015-11-11 MED ORDER — DEXTROSE 5 % IV SOLN
2.0000 g | INTRAVENOUS | Status: AC
  Administered 2015-11-12: 2 g via INTRAVENOUS
  Filled 2015-11-11: qty 2

## 2015-11-11 NOTE — Telephone Encounter (Signed)
I called Sara Pratt back and we discussed her main concern is that she has had a tummy tuck and hernia repair in February and she doesn't want the surgeon to do anything that would interfere with these. She also doesn't want any new scars besides her c/section scar.  She states she didn't really get to discuss this with Dr. Elly Modena. I informed her that Dr. Elly Modena is not here in the office today, but she will get to talk with her before surgery and before she gets any anesthesia . She is concerned Dr. Elly Modena may not be able to do surgery thru her c/section scar - I informed her I can send this messge urgently via computer as well as try to reach Dr. Elly Modena at the office she is working at today. She voices understanding.

## 2015-11-11 NOTE — Patient Instructions (Addendum)
Your procedure is scheduled on:  Tomorrow, Oct. 10, 2017  Enter through the [employer] [facility] at:  11:45 AM  Pick up the phone at the desk and dial [phone].  Call this number if you have problems the morning of surgery: [phone].  Remember: Do NOT eat food:  After Midnight Tonight  Do NOT drink clear liquids after:  9:00 AM Tomorrow  Take these medicines the morning of surgery with a SIP OF WATER:  None  Do Not smoke the day of surgery  Stop ALL herbal medications at this time   Do NOT wear jewelry (body piercing), metal hair clips/bobby pins, make-up, or nail polish. Do NOT wear lotions, powders, or perfumes.  You may wear deodorant. Do NOT shave for 48 hours prior to surgery. Do NOT bring valuables to the hospital. Contacts, dentures, or bridgework may not be worn into surgery.  Have a responsible adult drive you home and stay with you for 24 hours after your procedure

## 2015-11-11 NOTE — Telephone Encounter (Signed)
I sent message to Dr. Elly Modena and Dr. Elly Modena called back asked me to let patient know she understands her concerns and that the best way to do the surgery- least invasive is laparascopic- this decreases the chances there will be future complications/problems.  If they can't do it laparscopic would have to do another incision near her c/section scar. Discussed any time you have had prior surgeries there is risks involved.  Stress to patient mri did not show cancer so she doesn't have to have surgery tomorrow- can get second opinion and delay surgery a little while.    I called Sara Pratt and we had a long discussion of the information from Dr. Elly Modena - Sara Pratt finally decided she did not want to postpone the surgery because it is causing her some problems. She does plan to talk with Dr. Elly Modena before the surgery begins.

## 2015-11-11 NOTE — Telephone Encounter (Signed)
Sara Pratt called and left a message this am stating she is scheduled to have an outpatient procedure at 1pm tomorrow. States her concern is that she  Has had some work done right next to her belly button and was told she is supposed to have laparoscopic surgery and they will make a small incision . States she doesn't really want an incision underneath or around her belly button. States she does have a prior c/section, and lower bikini cut and wants to know if she can make the incision there.

## 2015-11-11 NOTE — Pre-Procedure Instructions (Signed)
Dr. Jillyn Hidden made aware of platelet count, no new orders received at this time.

## 2015-11-12 ENCOUNTER — Encounter (HOSPITAL_COMMUNITY)
Admission: RE | Disposition: A | Discharge status: Home / Self Care | Payer: Self-pay | Source: Ambulatory Visit | Attending: Obstetrics & Gynecology

## 2015-11-12 ENCOUNTER — Ambulatory Visit (HOSPITAL_COMMUNITY)
Admission: RE | Admit: 2015-11-12 | Discharge: 2015-11-12 | Disposition: A | Discharge status: Home / Self Care | Payer: Medicaid Other | Source: Ambulatory Visit | Attending: Obstetrics & Gynecology | Admitting: Obstetrics & Gynecology

## 2015-11-12 ENCOUNTER — Ambulatory Visit (HOSPITAL_COMMUNITY): Payer: Medicaid Other | Admitting: Anesthesiology

## 2015-11-12 ENCOUNTER — Encounter (HOSPITAL_COMMUNITY): Payer: Self-pay

## 2015-11-12 DIAGNOSIS — N858 Other specified noninflammatory disorders of uterus: Secondary | ICD-10-CM | POA: Diagnosis present

## 2015-11-12 DIAGNOSIS — R102 Pelvic and perineal pain: Secondary | ICD-10-CM | POA: Insufficient documentation

## 2015-11-12 DIAGNOSIS — K219 Gastro-esophageal reflux disease without esophagitis: Secondary | ICD-10-CM | POA: Insufficient documentation

## 2015-11-12 DIAGNOSIS — F17210 Nicotine dependence, cigarettes, uncomplicated: Secondary | ICD-10-CM | POA: Insufficient documentation

## 2015-11-12 DIAGNOSIS — K660 Peritoneal adhesions (postprocedural) (postinfection): Secondary | ICD-10-CM | POA: Insufficient documentation

## 2015-11-12 DIAGNOSIS — N9489 Other specified conditions associated with female genital organs and menstrual cycle: Secondary | ICD-10-CM

## 2015-11-12 DIAGNOSIS — N949 Unspecified condition associated with female genital organs and menstrual cycle: Secondary | ICD-10-CM | POA: Diagnosis not present

## 2015-11-12 DIAGNOSIS — Z90710 Acquired absence of both cervix and uterus: Secondary | ICD-10-CM | POA: Diagnosis not present

## 2015-11-12 HISTORY — PX: LAPAROSCOPY: SHX197

## 2015-11-12 HISTORY — PX: LYSIS OF ADHESION: SHX5961

## 2015-11-12 SURGERY — LAPAROTOMY, FOR LYSIS OF ADHESIONS
Anesthesia: General | Site: Abdomen | Laterality: Left

## 2015-11-12 MED ORDER — HYDROMORPHONE HCL 1 MG/ML IJ SOLN
0.2500 mg | INTRAMUSCULAR | Status: DC | PRN
  Administered 2015-11-12 (×2): 0.5 mg via INTRAVENOUS

## 2015-11-12 MED ORDER — ONDANSETRON HCL 4 MG/2ML IJ SOLN
INTRAMUSCULAR | Status: DC | PRN
  Administered 2015-11-12: 4 mg via INTRAVENOUS

## 2015-11-12 MED ORDER — DOCUSATE SODIUM 100 MG PO CAPS: 100.0000 mg | ORAL_CAPSULE | Freq: Two times a day (BID) | ORAL | 2 refills | Status: DC | PRN

## 2015-11-12 MED ORDER — MIDAZOLAM HCL 5 MG/5ML IJ SOLN
INTRAMUSCULAR | Status: DC | PRN
  Administered 2015-11-12: 2 mg via INTRAVENOUS

## 2015-11-12 MED ORDER — LIDOCAINE HCL (CARDIAC) 20 MG/ML IV SOLN
INTRAVENOUS | Status: AC
  Filled 2015-11-12: qty 5

## 2015-11-12 MED ORDER — DEXAMETHASONE SODIUM PHOSPHATE 10 MG/ML IJ SOLN
INTRAMUSCULAR | Status: AC
  Filled 2015-11-12: qty 1

## 2015-11-12 MED ORDER — ROCURONIUM BROMIDE 100 MG/10ML IV SOLN
INTRAVENOUS | Status: AC
  Filled 2015-11-12: qty 1

## 2015-11-12 MED ORDER — HYDROMORPHONE HCL 1 MG/ML IJ SOLN
INTRAMUSCULAR | Status: AC
  Filled 2015-11-12: qty 1

## 2015-11-12 MED ORDER — SUGAMMADEX SODIUM 200 MG/2ML IV SOLN
INTRAVENOUS | Status: AC
  Filled 2015-11-12: qty 2

## 2015-11-12 MED ORDER — ROCURONIUM BROMIDE 100 MG/10ML IV SOLN
INTRAVENOUS | Status: DC | PRN
  Administered 2015-11-12: 50 mg via INTRAVENOUS

## 2015-11-12 MED ORDER — SUGAMMADEX SODIUM 200 MG/2ML IV SOLN
INTRAVENOUS | Status: DC | PRN
  Administered 2015-11-12: 145 mg via INTRAVENOUS

## 2015-11-12 MED ORDER — LIDOCAINE HCL (CARDIAC) 20 MG/ML IV SOLN
INTRAVENOUS | Status: DC | PRN
  Administered 2015-11-12: 100 mg via INTRAVENOUS

## 2015-11-12 MED ORDER — BUPIVACAINE HCL (PF) 0.25 % IJ SOLN
INTRAMUSCULAR | Status: DC | PRN
  Administered 2015-11-12: 10 mL

## 2015-11-12 MED ORDER — HYDROMORPHONE HCL 1 MG/ML IJ SOLN
INTRAMUSCULAR | Status: DC | PRN
  Administered 2015-11-12: 1 mg via INTRAVENOUS

## 2015-11-12 MED ORDER — LACTATED RINGERS IV SOLN: INTRAVENOUS | Status: DC

## 2015-11-12 MED ORDER — LACTATED RINGERS IR SOLN
Status: DC | PRN
  Administered 2015-11-12: 3000 mL

## 2015-11-12 MED ORDER — ONDANSETRON HCL 4 MG/2ML IJ SOLN: 4.0000 mg | Freq: Once | INTRAMUSCULAR | Status: DC | PRN

## 2015-11-12 MED ORDER — MIDAZOLAM HCL 2 MG/2ML IJ SOLN
INTRAMUSCULAR | Status: AC
  Filled 2015-11-12: qty 2

## 2015-11-12 MED ORDER — FENTANYL CITRATE (PF) 250 MCG/5ML IJ SOLN
INTRAMUSCULAR | Status: AC
  Filled 2015-11-12: qty 5

## 2015-11-12 MED ORDER — FENTANYL CITRATE (PF) 100 MCG/2ML IJ SOLN
INTRAMUSCULAR | Status: DC | PRN
  Administered 2015-11-12: 100 ug via INTRAVENOUS
  Administered 2015-11-12: 50 ug via INTRAVENOUS
  Administered 2015-11-12: 100 ug via INTRAVENOUS

## 2015-11-12 MED ORDER — DEXAMETHASONE SODIUM PHOSPHATE 10 MG/ML IJ SOLN
INTRAMUSCULAR | Status: DC | PRN
  Administered 2015-11-12: 10 mg via INTRAVENOUS

## 2015-11-12 MED ORDER — OXYCODONE-ACETAMINOPHEN 5-325 MG PO TABS: 1.0000 | ORAL_TABLET | Freq: Three times a day (TID) | ORAL | 0 refills | Status: DC | PRN

## 2015-11-12 MED ORDER — LACTATED RINGERS IV SOLN
INTRAVENOUS | Status: DC
  Administered 2015-11-12 (×2): via INTRAVENOUS

## 2015-11-12 MED ORDER — BUPIVACAINE HCL (PF) 0.25 % IJ SOLN
INTRAMUSCULAR | Status: AC
  Filled 2015-11-12: qty 30

## 2015-11-12 MED ORDER — MEPERIDINE HCL 25 MG/ML IJ SOLN: 6.2500 mg | INTRAMUSCULAR | Status: DC | PRN

## 2015-11-12 MED ORDER — SCOPOLAMINE 1 MG/3DAYS TD PT72
1.0000 | MEDICATED_PATCH | Freq: Once | TRANSDERMAL | Status: DC
  Administered 2015-11-12: 1.5 mg via TRANSDERMAL

## 2015-11-12 MED ORDER — SCOPOLAMINE 1 MG/3DAYS TD PT72
MEDICATED_PATCH | TRANSDERMAL | Status: AC
  Administered 2015-11-12: 1.5 mg via TRANSDERMAL
  Filled 2015-11-12: qty 1

## 2015-11-12 MED ORDER — ONDANSETRON HCL 4 MG/2ML IJ SOLN
INTRAMUSCULAR | Status: AC
  Filled 2015-11-12: qty 2

## 2015-11-12 MED ORDER — PROPOFOL 10 MG/ML IV BOLUS
INTRAVENOUS | Status: DC | PRN
  Administered 2015-11-12: 200 mg via INTRAVENOUS

## 2015-11-12 MED ORDER — PROPOFOL 10 MG/ML IV BOLUS
INTRAVENOUS | Status: AC
  Filled 2015-11-12: qty 20

## 2015-11-12 SURGICAL SUPPLY — 31 items
DRSG OPSITE POSTOP 3X4 (GAUZE/BANDAGES/DRESSINGS) IMPLANT
DURAPREP 26ML APPLICATOR (WOUND CARE) ×4 IMPLANT
ELECT REM PT RETURN 9FT ADLT (ELECTROSURGICAL) ×4
ELECTRODE REM PT RTRN 9FT ADLT (ELECTROSURGICAL) ×2 IMPLANT
GLOVE BIOGEL PI IND STRL 6.5 (GLOVE) ×2 IMPLANT
GLOVE BIOGEL PI IND STRL 7.0 (GLOVE) ×2 IMPLANT
GLOVE BIOGEL PI INDICATOR 6.5 (GLOVE) ×2
GLOVE BIOGEL PI INDICATOR 7.0 (GLOVE) ×2
GLOVE SURG SS PI 6.0 STRL IVOR (GLOVE) ×4 IMPLANT
GOWN STRL REUS W/TWL LRG LVL3 (GOWN DISPOSABLE) ×8 IMPLANT
LIQUID BAND (GAUZE/BANDAGES/DRESSINGS) ×4 IMPLANT
NS IRRIG 1000ML POUR BTL (IV SOLUTION) ×4 IMPLANT
PACK LAPAROSCOPY BASIN (CUSTOM PROCEDURE TRAY) ×4 IMPLANT
PACK TRENDGUARD 450 HYBRID PRO (MISCELLANEOUS) ×2 IMPLANT
PACK TRENDGUARD 600 HYBRD PROC (MISCELLANEOUS) IMPLANT
POUCH SPECIMEN RETRIEVAL 10MM (ENDOMECHANICALS) IMPLANT
PROTECTOR NERVE ULNAR (MISCELLANEOUS) ×8 IMPLANT
SCISSORS LAP 5X35 DISP (ENDOMECHANICALS) ×4 IMPLANT
SET IRRIG TUBING LAPAROSCOPIC (IRRIGATION / IRRIGATOR) ×4 IMPLANT
SHEARS HARMONIC ACE PLUS 36CM (ENDOMECHANICALS) IMPLANT
SLEEVE XCEL OPT CAN 5 100 (ENDOMECHANICALS) ×4 IMPLANT
SUT MNCRL AB 4-0 PS2 18 (SUTURE) ×4 IMPLANT
SUT VICRYL 0 UR6 27IN ABS (SUTURE) ×4 IMPLANT
TOWEL OR 17X24 6PK STRL BLUE (TOWEL DISPOSABLE) ×8 IMPLANT
TRAY FOLEY CATH SILVER 14FR (SET/KITS/TRAYS/PACK) ×4 IMPLANT
TRENDGUARD 450 HYBRID PRO PACK (MISCELLANEOUS) ×4
TRENDGUARD 600 HYBRID PROC PK (MISCELLANEOUS)
TROCAR BALLN 12MMX100 BLUNT (TROCAR) ×4 IMPLANT
TROCAR XCEL NON-BLD 11X100MML (ENDOMECHANICALS) ×4 IMPLANT
TROCAR XCEL NON-BLD 5MMX100MML (ENDOMECHANICALS) ×4 IMPLANT
WATER STERILE IRR 1000ML POUR (IV SOLUTION) ×4 IMPLANT

## 2015-11-12 NOTE — Transfer of Care (Signed)
Immediate Anesthesia Transfer of Care Note  Patient: Sara Pratt  Procedure(s) Performed: Procedure(s): LYSIS OF ADHESION LAPAROSCOPY DIAGNOSTIC  Patient Location: PACU  Anesthesia Type:General  Level of Consciousness: awake, alert  and oriented  Airway & Oxygen Therapy: Patient Spontanous Breathing and Patient connected to nasal cannula oxygen  Post-op Assessment: Report given to RN, Post -op Vital signs reviewed and stable and Patient moving all extremities  Post vital signs: Reviewed and stable  Last Vitals:  Vitals:   11/12/15 1207  BP: 124/80  Pulse: (!) 55  Resp: 18  Temp: 36.8 C    Last Pain:  Vitals:   11/12/15 1207  TempSrc: Oral      Patients Stated Pain Goal: 3 ([id number])  Complications: No apparent anesthesia complications

## 2015-11-12 NOTE — Anesthesia Procedure Notes (Signed)
Procedure Name: Intubation Date/Time: 11/12/2015 1:43 PM Performed by: Riki Sheer Pre-anesthesia Checklist: Patient identified Patient Re-evaluated:Patient Re-evaluated prior to inductionOxygen Delivery Method: Circle system utilized Preoxygenation: Pre-oxygenation with 100% oxygen Intubation Type: IV induction Ventilation: Mask ventilation without difficulty Laryngoscope Size: Miller and 2 Grade View: Grade I Tube type: Oral Tube size: 7.0 mm Number of attempts: 1 Airway Equipment and Method: Stylet Placement Confirmation: ETT inserted through vocal cords under direct vision,  positive ETCO2,  CO2 detector and breath sounds checked- equal and bilateral Secured at: 21 cm Tube secured with: Tape Dental Injury: Teeth and Oropharynx as per pre-operative assessment

## 2015-11-12 NOTE — Discharge Instructions (Signed)
Post Anesthesia Home Care Instructions   Activity: Get plenty of rest for the remainder of the day. A responsible adult should stay with you for 24 hours following the procedure.  For the next 24 hours, DO NOT: -Drive a car -Paediatric nurse -Drink alcoholic beverages -Take any medication unless instructed by your physician -Make any legal decisions or sign important papers.  Meals: Start with liquid foods such as gelatin or soup. Progress to regular foods as tolerated. Avoid greasy, spicy, heavy foods. If nausea and/or vomiting occur, drink only clear liquids until the nausea and/or vomiting subsides. Call your physician if vomiting continues.  Special Instructions/Symptoms: Your throat may feel dry or sore from the anesthesia or the breathing tube placed in your throat during surgery. If this causes discomfort, gargle with warm salt water. The discomfort should disappear within 24 hours.  If you had a scopolamine patch placed behind your ear for the management of post- operative nausea and/or vomiting:  1. The medication in the patch is effective for 72 hours, after which it should be removed.  Wrap patch in a tissue and discard in the trash. Wash hands thoroughly with soap and water. 2. You may remove the patch earlier than 72 hours if you experience unpleasant side effects which may include dry mouth, dizziness or visual disturbances. 3. Avoid touching the patch. Wash your hands with soap and water after contact with the patch.   Diagnostic Laparoscopy A diagnostic laparoscopy is a procedure to diagnose diseases in the abdomen. During the procedure, a thin, lighted, pencil-sized instrument called a laparoscope is inserted into the abdomen through an incision. The laparoscope allows your health care provider to look at the organs inside your body. LET [facility] CARE PROVIDER KNOW ABOUT:  Any allergies you have.  All medicines you are taking, including vitamins, herbs, eye drops,  creams, and over-the-counter medicines.  Previous problems you or members of your family have had with the use of anesthetics.  Any blood disorders you have.  Previous surgeries you have had.  Medical conditions you have. RISKS AND COMPLICATIONS  Generally, this is a safe procedure. However, problems can occur, which may include:  Infection.  Bleeding.  Damage to other organs.  Allergic reaction to the anesthetics used during the procedure. BEFORE THE PROCEDURE  Do not eat or drink anything after midnight on the night before the procedure or as directed by your health care provider.  Ask your health care provider about:  Changing or stopping your regular medicines.  Taking medicines such as aspirin and ibuprofen. These medicines can thin your blood. Do not take these medicines before your procedure if your health care provider instructs you not to.  Plan to have someone take you home after the procedure. PROCEDURE  You may be given a medicine to help you relax (sedative).  You will be given a medicine to make you sleep (general anesthetic).  Your abdomen will be inflated with a gas. This will make your organs easier to see.  Small incisions will be made in your abdomen.  A laparoscope and other small instruments will be inserted into the abdomen through the incisions.  A tissue sample may be removed from an organ in the abdomen for examination.  The instruments will be removed from the abdomen.  The gas will be released.  The incisions will be closed with stitches (sutures). AFTER THE PROCEDURE  Your blood pressure, heart rate, breathing rate, and blood oxygen level will be monitored often until the  medicines you were given have worn off.   This information is not intended to replace advice given to you by your health care provider. Make sure you discuss any questions you have with your health care provider.   Document Released: 04/27/2000 Document Revised:  10/10/2014 Document Reviewed: 09/01/2013 Elsevier Interactive Patient Education [employer].

## 2015-11-12 NOTE — Anesthesia Postprocedure Evaluation (Signed)
Anesthesia Post Note  Patient: Sara Pratt  Procedure(s) Performed: Procedure(s): LYSIS OF ADHESION LAPAROSCOPY DIAGNOSTIC  Patient location during evaluation: PACU Anesthesia Type: General Level of consciousness: sedated Pain management: pain level controlled Vital Signs Assessment: post-procedure vital signs reviewed and stable Respiratory status: spontaneous breathing Cardiovascular status: stable Postop Assessment: no signs of nausea or vomiting Anesthetic complications: no     Last Vitals:  Vitals:   11/12/15 1456 11/12/15 1500  BP: (!) 145/86 (!) 155/87  Pulse: 80 84  Resp: 12 14  Temp: 36.4 C     Last Pain:  Vitals:   11/12/15 1515  TempSrc:   PainSc: Asleep   Pain Goal: Patients Stated Pain Goal: 3 (11/12/15 1207)               Lead

## 2015-11-12 NOTE — H&P (Signed)
Sara Pratt is an 40 y.o. female  [phone] presenting today for left adnexal mass removal. Patient is doing well reporting persistent left pelvic pain and at times left lower extremity pain. Patient is otherwise without complaint  Pertinent Gynecological History: Menses: none- no uterus Bleeding: none Contraception: none DES exposure: denies Blood transfusions: none Previous GYN Procedures: hysterectomy at the time of last cesarean section     Menstrual History: No LMP recorded. Patient has had a hysterectomy.    Past Medical History:  Diagnosis Date  . Anxiety   . Arthritis   . Asthma   . GERD (gastroesophageal reflux disease)   . Pneumonia   . PONV (postoperative nausea and vomiting)   . Scoliosis     Past Surgical History:  Procedure Laterality Date  . ABDOMINAL HYSTERECTOMY    . ABDOMINOPLASTY    . APPENDECTOMY    . CESAREAN SECTION    . CHOLECYSTECTOMY    . HERNIA REPAIR      History reviewed. No pertinent family history.  Social History:  reports that she has been smoking Cigarettes.  She has a 16.00 pack-year smoking history. She has never used smokeless tobacco. She reports that she drinks alcohol. She reports that she does not use drugs.  Allergies: No Known Allergies  Prescriptions Prior to Admission  Medication Sig Dispense Refill Last Dose  . hydrOXYzine (ATARAX/VISTARIL) 25 MG tablet Take 25 mg by mouth 3 (three) times daily as needed for anxiety.   Past Week at unknown  . naproxen (NAPROSYN) 500 MG tablet Take 500 mg by mouth 2 (two) times daily as needed for mild pain.   Past Month at unknown  . ondansetron (ZOFRAN ODT) 4 MG disintegrating tablet Take 1 tablet (4 mg total) by mouth every 8 (eight) hours as needed for nausea or vomiting. 30 tablet 1 11/11/2015 at unknown  . oxyCODONE-acetaminophen (PERCOCET) 5-325 MG tablet Take 1-2 tablets by mouth every 8 (eight) hours as needed for severe pain. 20 tablet 0 Past Week at unknown  . traZODone (DESYREL)  150 MG tablet Take 150 mg by mouth at bedtime.   unknown at unknown    ROS See pertinent in HPI Blood pressure 124/80, pulse (!) 55, temperature 98.2 F (36.8 C), temperature source Oral, resp. rate 18, SpO2 99 %. Physical Exam GENERAL: Well-developed, well-nourished female in no acute distress.  HEENT: Normocephalic, atraumatic. Sclerae anicteric.  NECK: Supple. Normal thyroid.  LUNGS: Clear to auscultation bilaterally.  HEART: Regular rate and rhythm. ABDOMEN: Soft, nontender, nondistended. No organomegaly. PELVIC: Deferred to OR EXTREMITIES: No cyanosis, clubbing, or edema, 2+ distal pulses.  Results for orders placed or performed during the hospital encounter of 11/11/15 (from the past 24 hour(s))  CBC     Status: Abnormal   Collection Time: 11/11/15  2:55 PM  Result Value Ref Range   WBC 6.0 4.0 - 10.5 K/uL   RBC 4.78 3.87 - 5.11 MIL/uL   Hemoglobin 13.8 12.0 - 15.0 g/dL   HCT 37.8 36.0 - 46.0 %   MCV 79.1 78.0 - 100.0 fL   MCH 28.9 26.0 - 34.0 pg   MCHC 36.5 (H) 30.0 - 36.0 g/dL   RDW 14.2 11.5 - 15.5 %   Platelets 135 (L) 150 - 400 K/uL    No results found. 10/30/15 MRI FINDINGS: Uterus:  Previous hysterectomy  Endometrium:  Not applicable.  Cervix/Vagina:  Appears normal.  Right ovary: Appears normal. No mass identified. Several prominent follicles are noted. The largest measures 1.5  cm.  Left ovary: The left ovary is not visualized. There is a cystic mass containing internal septation and small mural nodule identified. This measures 5.9 x 3.4 x 7.3 cm. Eccentric enhancing mural nodule is identified measuring 1.2 cm, image 13 of series 10. There is a single internal area of septation which also enhances, image number 16 of series 15.  Urinary Tract:  Bladder appears normal.  Bowel:  Unremarkable visualized pelvic bowel loops.  Vascular/Lymphatic: No pathologically enlarged lymph nodes. No significant vascular abnormality seen.  Other: There is  a small amount of free fluid noted within the dependent portion of the pelvis.  Musculoskeletal:  Unremarkable.  IMPRESSION: 1. Cystic mass within the left adnexa is identified and is indeterminate. This contains at least one area of internal enhancing septation and a small mural nodule which also enhances. Cannot rule out benign or malignant cystic ovarian neoplasm. Surgical consultation advised.   Electronically Signed   By: Kerby Moors M.D.   On: 10/30/2015 18:36  Assessment/Plan: 40 yo with a left adnexal mass and pelvic pain here for definitve surgical intervention - Risks, benefits and alternatives were reviewed and explained to the patient. Including but not limited to risks of bleeding, infection and damage to adjacent organs. Patient is consented for a laparoscopic unilateral salpingo-oophorectomy with possible laparotomy. Patient understands that she may have to stay overnight. All questions were answered.  Sara Pratt 11/12/2015, 12:50 PM

## 2015-11-12 NOTE — Anesthesia Preprocedure Evaluation (Signed)
Anesthesia Evaluation  Patient identified by MRN, date of birth, ID band Patient awake    Reviewed: Allergy & Precautions, NPO status , Patient's Chart, lab work & pertinent test results  History of Anesthesia Complications (+) PONV  Airway Mallampati: I  TM Distance: >3 FB Neck ROM: Full    Dental   Pulmonary Current Smoker,    Pulmonary exam normal        Cardiovascular Normal cardiovascular exam     Neuro/Psych Anxiety    GI/Hepatic GERD  Medicated and Controlled,  Endo/Other    Renal/GU      Musculoskeletal   Abdominal   Peds  Hematology   Anesthesia Other Findings   Reproductive/Obstetrics                             Anesthesia Physical Anesthesia Plan  ASA: II  Anesthesia Plan: General   Post-op Pain Management:    Induction: Intravenous  Airway Management Planned: Oral ETT  Additional Equipment:   Intra-op Plan:   Post-operative Plan: Extubation in OR  Informed Consent: I have reviewed the patients History and Physical, chart, labs and discussed the procedure including the risks, benefits and alternatives for the proposed anesthesia with the patient or authorized representative who has indicated his/her understanding and acceptance.     Plan Discussed with: CRNA and Surgeon  Anesthesia Plan Comments:         Anesthesia Quick Evaluation

## 2015-11-12 NOTE — Op Note (Signed)
Sara Pratt PROCEDURE DATE: 11/12/2015  PREOPERATIVE DIAGNOSIS: left adnexal mass POSTOPERATIVE DIAGNOSIS: bowel adhesion PROCEDURE: Diagnostic laparoscopy, enterolysis SURGEON:  Dr. Mora Bellman ASSISTANT: Dr. Ihor Dow  INDICATIONS: 40 y.o. with history of pelvic pain and radiologic finding of a left adnexal mass desiring surgical evaluation.   Please see preoperative notes for further details.  Risks of surgery were discussed with the patient including but not limited to: bleeding which may require transfusion or reoperation; infection which may require antibiotics; injury to bowel, bladder, ureters or other surrounding organs; need for additional procedures including laparotomy; thromboembolic phenomenon, incisional problems and other postoperative/anesthesia complications. Written informed consent was obtained.    FINDINGS:   normal right ovaries and fallopian tube. Uterus is absent. Bowel adhesion in the region of the left adnexa.  Evidence of Sara Pratt. No other abdominal/pelvic abnormality.  Normal upper abdomen.  ANESTHESIA:    General INTRAVENOUS FLUIDS: 1000 ml ESTIMATED BLOOD LOSS: 50 ml URINE OUTPUT: 150 ml SPECIMENS: none COMPLICATIONS: None immediate  PROCEDURE IN DETAIL:  The patient had sequential compression devices applied to her lower extremities while in the preoperative area.  She was then taken to the operating room where general anesthesia was administered and was found to be adequate.  She was placed in the dorsal lithotomy position, and was prepped and draped in a sterile manner.  A Foley catheter was inserted into her bladder and attached to Sara Pratt drainage. After an adequate timeout was performed, attention was then turned to the patient's abdomen where a 11-mm skin incision was made in the supra- umbilical region over her previous scar used for laparoscopic cholecystectomy.  The underlying fascia was grasped with kocher clamps and incised. The  fascia was tagged with 0-Vicryl. The peritoneum was grasped with kelly clamps and entered sharply with Metzenbaum scissors.  The 10-mm trocar and sleeve were then advanced without difficulty into the abdomen where intraabdominal placement was confirmed by the laparoscope.  A detailed survey of the patient's pelvis and abdomen revealed the findings as mentioned above.   Using Bowel forceps and endoshears, enterolysis was performed. The left ovary was never identified. Copious irrigation was perforrmed. The operative site was surveyed, and it was found to be hemostatic.  No intraoperative injury to surrounding organs was noted.  The abdomen was desufflated and all instruments were then removed from the patient's abdomen. All incisions were closed with 2.0 vicryl and Dermabond. The patient tolerated the procedures well.  All instruments, needles, and sponge counts were correct x 2. The patient was taken to the recovery room in stable condition.

## 2015-11-14 ENCOUNTER — Encounter (HOSPITAL_COMMUNITY): Payer: Self-pay | Admitting: Obstetrics and Gynecology

## 2015-11-27 ENCOUNTER — Encounter (HOSPITAL_COMMUNITY): Payer: Self-pay

## 2015-11-27 ENCOUNTER — Inpatient Hospital Stay (HOSPITAL_COMMUNITY)
Admission: AD | Admit: 2015-11-27 | Discharge: 2015-11-27 | Disposition: A | Discharge status: Home / Self Care | Payer: Medicaid Other | Source: Ambulatory Visit | Attending: Obstetrics & Gynecology | Admitting: Obstetrics & Gynecology

## 2015-11-27 ENCOUNTER — Emergency Department (HOSPITAL_COMMUNITY)
Admission: EM | Admit: 2015-11-27 | Discharge: 2015-11-27 | Disposition: A | Discharge status: Home / Self Care | Payer: Medicaid Other | Source: Home / Self Care

## 2015-11-27 DIAGNOSIS — F17210 Nicotine dependence, cigarettes, uncomplicated: Secondary | ICD-10-CM | POA: Insufficient documentation

## 2015-11-27 DIAGNOSIS — G8929 Other chronic pain: Secondary | ICD-10-CM | POA: Diagnosis not present

## 2015-11-27 DIAGNOSIS — R109 Unspecified abdominal pain: Secondary | ICD-10-CM

## 2015-11-27 DIAGNOSIS — R102 Pelvic and perineal pain: Secondary | ICD-10-CM | POA: Diagnosis not present

## 2015-11-27 DIAGNOSIS — J45909 Unspecified asthma, uncomplicated: Secondary | ICD-10-CM | POA: Insufficient documentation

## 2015-11-27 DIAGNOSIS — K660 Peritoneal adhesions (postprocedural) (postinfection): Secondary | ICD-10-CM

## 2015-11-27 DIAGNOSIS — Z5321 Procedure and treatment not carried out due to patient leaving prior to being seen by health care provider: Secondary | ICD-10-CM | POA: Insufficient documentation

## 2015-11-27 HISTORY — DX: Peritoneal adhesions (postprocedural) (postinfection): K66.0

## 2015-11-27 LAB — CBC
HCT: 41.4 % (ref 36.0–46.0)
HCT: 40 % (ref 36.0–46.0)
HEMOGLOBIN: 14.4 g/dL (ref 12.0–15.0)
Hemoglobin: 15 g/dL (ref 12.0–15.0)
MCH: 28.7 pg (ref 26.0–34.0)
MCH: 28.4 pg (ref 26.0–34.0)
MCHC: 36.2 g/dL — ABNORMAL HIGH (ref 30.0–36.0)
MCHC: 36 g/dL (ref 30.0–36.0)
MCV: 79.2 fL (ref 78.0–100.0)
MCV: 78.9 fL (ref 78.0–100.0)
PLATELETS: 141 10*3/uL — AB (ref 150–400)
PLATELETS: 147 10*3/uL — AB (ref 150–400)
RBC: 5.23 MIL/uL — AB (ref 3.87–5.11)
RBC: 5.07 MIL/uL (ref 3.87–5.11)
RDW: 13.4 % (ref 11.5–15.5)
RDW: 13.7 % (ref 11.5–15.5)
WBC: 9.2 10*3/uL (ref 4.0–10.5)
WBC: 9.6 10*3/uL (ref 4.0–10.5)

## 2015-11-27 LAB — URINE MICROSCOPIC-ADD ON

## 2015-11-27 LAB — URINALYSIS, ROUTINE W REFLEX MICROSCOPIC
Bilirubin Urine: NEGATIVE
Glucose, UA: NEGATIVE mg/dL
Hgb urine dipstick: NEGATIVE
Ketones, ur: NEGATIVE mg/dL
LEUKOCYTES UA: NEGATIVE
Nitrite: NEGATIVE
PROTEIN: NEGATIVE mg/dL
Specific Gravity, Urine: 1.023 (ref 1.005–1.030)
pH: 8.5 — ABNORMAL HIGH (ref 5.0–8.0)

## 2015-11-27 LAB — COMPREHENSIVE METABOLIC PANEL
ALK PHOS: 33 U/L — AB (ref 38–126)
ALT: 11 U/L — AB (ref 14–54)
ANION GAP: 8 (ref 5–15)
AST: 18 U/L (ref 15–41)
Albumin: 4 g/dL (ref 3.5–5.0)
BUN: 10 mg/dL (ref 6–20)
CALCIUM: 9.3 mg/dL (ref 8.9–10.3)
CHLORIDE: 104 mmol/L (ref 101–111)
CO2: 26 mmol/L (ref 22–32)
CREATININE: 0.8 mg/dL (ref 0.44–1.00)
Glucose, Bld: 95 mg/dL (ref 65–99)
Potassium: 3.8 mmol/L (ref 3.5–5.1)
Sodium: 138 mmol/L (ref 135–145)
Total Bilirubin: 0.8 mg/dL (ref 0.3–1.2)
Total Protein: 6.5 g/dL (ref 6.5–8.1)

## 2015-11-27 LAB — LIPASE, BLOOD: LIPASE: 47 U/L (ref 11–51)

## 2015-11-27 MED ORDER — OXYCODONE-ACETAMINOPHEN 5-325 MG PO TABS: 1.0000 | ORAL_TABLET | Freq: Four times a day (QID) | ORAL | 0 refills | Status: DC | PRN

## 2015-11-27 MED ORDER — PROMETHAZINE HCL 25 MG PO TABS: 25.0000 mg | ORAL_TABLET | Freq: Four times a day (QID) | ORAL | 0 refills | Status: DC | PRN

## 2015-11-27 MED ORDER — ONDANSETRON 8 MG PO TBDP
8.0000 mg | ORAL_TABLET | Freq: Once | ORAL | Status: AC
  Administered 2015-11-27: 8 mg via ORAL
  Filled 2015-11-27: qty 1

## 2015-11-27 MED ORDER — PROMETHAZINE HCL 25 MG PO TABS
25.0000 mg | ORAL_TABLET | Freq: Four times a day (QID) | ORAL | 0 refills | Status: DC | PRN
Start: 2015-11-27 — End: 2015-11-27

## 2015-11-27 MED ORDER — HYDROMORPHONE HCL 1 MG/ML IJ SOLN
1.0000 mg | Freq: Once | INTRAMUSCULAR | Status: AC
  Administered 2015-11-27: 1 mg via INTRAMUSCULAR
  Filled 2015-11-27: qty 1

## 2015-11-27 MED ORDER — HYDROMORPHONE HCL 2 MG/ML IJ SOLN
2.0000 mg | Freq: Once | INTRAMUSCULAR | Status: AC
  Administered 2015-11-27: 2 mg via INTRAMUSCULAR
  Filled 2015-11-27: qty 1

## 2015-11-27 NOTE — ED Notes (Signed)
Pt came up to to Nurse 1st wanting to know how much longer, told the the pt how many people was left in front of her and explained we are doing the best we can and she was asking about other hospitals. Pt went and sat back in her chair

## 2015-11-27 NOTE — MAU Note (Signed)
Pt had surgery last week for ovarian cyst. Pt states she started having bad pain on Monday and has been getting progressively worse. Pt states she has vomiting since yesterday. Pt denies vaginal bleeding.

## 2015-11-27 NOTE — Discharge Instructions (Signed)
Adhesions Adhesions are stringy (fibrous) bands of tissue. Adhesions are similar to scars, but they are on the inside of your body. Adhesions form between two surfaces of the body. CAUSES   The most common adhesions are those that occur following surgery. Touching and moving things within the belly (abdomen) leads to inflammation and adhesions can form. Not all people get adhesions following surgery. But, adhesions may happen even with the gentlest handling of the organs in the abdomen. There is no way to predict who will have adhesions. The adhesions can occur between:  Loops of bowel.  The bowel and other organs such as the liver.  The contents in the pelvis such as the uterus, bladder, ovaries and tubes.  Inflammation within the abdomen even if there is no surgery. An example would be an infection in the tubes and uterus (pelvic inflammatory disease). Any infection will cause inflammation that may lead to adhesions.  Radiation treatment. SYMPTOMS  Many people have no signs or symptoms from adhesions. However, adhesions may cause a number of symptoms. Some of these are:  Abdominal pain, tenderness or cramping.  Abdominal bloating.  Constipation or diarrhea.  Vomiting.  Painful sex.  In females, difficulty getting pregnant. DIAGNOSIS   An exam by your caregiver may suggest that adhesions are present.  A surgical procedure using a small incision and a thin scope can be used to look inside the abdomen at the adhesions.  Sometimes x-rays may suggest adhesions inside the abdomen. TREATMENT  Surgery can be performed to separate the adhesions. This often takes care of the problems caused by the adhesions, although surgery may also cause more adhesions to develop. PROGNOSIS   The outcome is usually good if an operation is used to fix adhesions inside the belly.  All adhesions may reoccur and the problem can come back. HOME CARE INSTRUCTIONS   Take usual medications as directed  by your caregiver.  Only take over-the-counter or prescription medicines for pain, discomfort or fever as directed by your caregiver.  Pay attention to the pain:  Has it changed?  Has it moved?  Is it gone?  Do not eat solid food until your pain is gone.  While you have pain: Stay on a clear liquid diet. A clear liquid is one you can see through (water, weak tea, broth or bouillon, lemon lime carbonated drinks, gelatin, popsicles or ice chips).  When your pain is gone: Start a light diet (dry toast, crackers, applesauce, white rice, bananas, broth or bouillon). Increase the diet slowly as long as it does not bother you. No dairy products (including cheese and eggs) and no spicy, fatty, fried or high fiber foods. Your caregiver will tell you if you should be on a special diet.  No alcohol, caffeine or cigarettes.  If your caregiver has given you a follow-up appointment, it is very important to keep that appointment. Not keeping the appointment could result in a permanent injury or lasting (chronic) pain or disability. SEEK IMMEDIATE MEDICAL CARE IF:   Your pain is not gone in 24 hours.  Your pain becomes worse, changes location or feels different.  You have a fever.  Your vomiting will not stop.  You have blood or brown flecks (like coffee grounds) in your vomit.  You have blood in your bowel movements.  Your bowel movements are dark or black.  Your bowel movements stop (become blocked) or you can not pass gas.   This information is not intended to replace advice given to  you by your health care provider. Make sure you discuss any questions you have with your health care provider.   Document Released: 04/11/2003 Document Revised: 04/13/2011 Document Reviewed: 11/09/2008 Elsevier Interactive Patient Education [employer].

## 2015-11-27 NOTE — MAU Provider Note (Signed)
Chief Complaint: Abdominal Pain   First Provider Initiated Contact with Patient 11/27/15 2002      SUBJECTIVE HPI: Sara Pratt is a 40 y.o. G6P0 who presents to maternity admissions reporting LLQ abdominal pain starting 1 month ago and not improving after surgery 11/12/15, then worsening today.  She reports the pain is constant, unresolved with rest, heat, position change, Tylenol, and is associated with nausea and vomiting.  She had surgery for peritoneal adhesions with fluid collection on 11/12/15.  She reports her pain was unchanged after surgery but the pain medication after surgery did help.  Now, her pain is more constant and more severe than when she went home after surgery. She is s/p hysterectomy. She denies vaginal bleeding, vaginal itching/burning, urinary symptoms, h/a, dizziness, or fever/chills.     HPI  Past Medical History:  Diagnosis Date  . Anxiety   . Arthritis   . Asthma   . GERD (gastroesophageal reflux disease)   . Pneumonia   . PONV (postoperative nausea and vomiting)   . Scoliosis    Past Surgical History:  Procedure Laterality Date  . ABDOMINAL HYSTERECTOMY    . ABDOMINOPLASTY    . APPENDECTOMY    . CESAREAN SECTION    . CHOLECYSTECTOMY    . HERNIA REPAIR    . LAPAROSCOPY  11/12/2015   Procedure: LAPAROSCOPY DIAGNOSTIC;  Surgeon: Mora Bellman, MD;  Location: [facility];  Service: Gynecology;;  . LYSIS OF ADHESION  11/12/2015   Procedure: LYSIS OF ADHESION;  Surgeon: Mora Bellman, MD;  Location: [facility];  Service: Gynecology;;   Social History   Social History  . Marital status: Divorced    Spouse name: N/A  . Number of children: N/A  . Years of education: N/A   Occupational History  . Not on file.   Social History Main Topics  . Smoking status: Current Some Day Smoker    Packs/day: 2.00    Years: 8.00    Types: Cigarettes  . Smokeless tobacco: Never Used  . Alcohol use Yes  . Drug use: No  . Sexual activity: Yes    Birth control/  protection: None, Surgical   Other Topics Concern  . Not on file   Social History Narrative  . No narrative on file   No current facility-administered medications on file prior to encounter.    Current Outpatient Prescriptions on File Prior to Encounter  Medication Sig Dispense Refill  . docusate sodium (COLACE) 100 MG capsule Take 1 capsule (100 mg total) by mouth 2 (two) times daily as needed. (Patient taking differently: Take 100 mg by mouth 2 (two) times daily as needed for mild constipation. ) 30 capsule 2  . hydrOXYzine (ATARAX/VISTARIL) 25 MG tablet Take 25 mg by mouth 3 (three) times daily as needed for anxiety.    . naproxen (NAPROSYN) 500 MG tablet Take 500 mg by mouth 2 (two) times daily as needed for mild pain.    Marland Kitchen ondansetron (ZOFRAN ODT) 4 MG disintegrating tablet Take 1 tablet (4 mg total) by mouth every 8 (eight) hours as needed for nausea or vomiting. 30 tablet 1  . oxyCODONE-acetaminophen (PERCOCET) 5-325 MG tablet Take 1-2 tablets by mouth every 8 (eight) hours as needed for severe pain. 20 tablet 0   No Known Allergies  ROS:  Review of Systems  Constitutional: Negative for chills, fatigue and fever.  Respiratory: Negative for shortness of breath.   Cardiovascular: Negative for chest pain.  Gastrointestinal: Positive for abdominal pain, nausea and  vomiting. Negative for constipation and diarrhea.  Genitourinary: Positive for pelvic pain. Negative for difficulty urinating, dysuria, flank pain, vaginal bleeding, vaginal discharge and vaginal pain.  Neurological: Negative for dizziness and headaches.  Psychiatric/Behavioral: Negative.      I have reviewed patient's Past Medical Hx, Surgical Hx, Family Hx, Social Hx, medications and allergies.   Physical Exam  Patient Vitals for the past 24 hrs:  BP Temp Temp src Pulse Resp SpO2 Height Weight  11/27/15 1820 146/78 98.4 F (36.9 C) Oral 90 20 100 % 5\' 4"  (1.626 m) 150 lb (68 kg)   Constitutional: Well-developed,  well-nourished female in moderate distress.  Cardiovascular: normal rate Respiratory: normal effort GI: Abd soft, tender in LLQ, no palpable abnormalities. Pos BS x 4 MS: Extremities nontender, no edema, normal ROM Neurologic: Alert and oriented x 4.  GU: Neg CVAT.  PELVIC EXAM: Deferred  LAB RESULTS Results for orders placed or performed during the hospital encounter of 11/27/15 (from the past 24 hour(s))  CBC     Status: Abnormal   Collection Time: 11/27/15  7:05 PM  Result Value Ref Range   WBC 9.6 4.0 - 10.5 K/uL   RBC 5.07 3.87 - 5.11 MIL/uL   Hemoglobin 14.4 12.0 - 15.0 g/dL   HCT 40.0 36.0 - 46.0 %   MCV 78.9 78.0 - 100.0 fL   MCH 28.4 26.0 - 34.0 pg   MCHC 36.0 30.0 - 36.0 g/dL   RDW 13.7 11.5 - 15.5 %   Platelets 147 (L) 150 - 400 K/uL       IMAGING   MAU Management/MDM: Ordered labs and reviewed results.  Consult Dr Elonda Husky with history, assessment, labs.  No evidence of acute abdomen today.  Likely chronic pelvic pain, not completely resolved by surgical removal of adhesions/fluid collection earlier this month.  Pt has appointment tomorrow with Dr Elly Modena.  Needs f/u for pain management for chronic pain.  Treatments in MAU included Dilaudid 1 mg x 2 doses IM. Phenergan 25 mg PO x 1 dose. Rx for Phenergan 25 mg PO Q 6 hours PRN. Pt stable at time of discharge.  ASSESSMENT 1. Peritoneal adhesions   2. Chronic pelvic pain in female     PLAN Discharge home   Medication List    TAKE these medications   docusate sodium 100 MG capsule Commonly known as:  COLACE Take 1 capsule (100 mg total) by mouth 2 (two) times daily as needed. What changed:  reasons to take this   hydrOXYzine 25 MG tablet Commonly known as:  ATARAX/VISTARIL Take 25 mg by mouth 3 (three) times daily as needed for anxiety.   naproxen 500 MG tablet Commonly known as:  NAPROSYN Take 500 mg by mouth 2 (two) times daily as needed for mild pain.   ondansetron 4 MG disintegrating  tablet Commonly known as:  ZOFRAN ODT Take 1 tablet (4 mg total) by mouth every 8 (eight) hours as needed for nausea or vomiting.   oxyCODONE-acetaminophen 5-325 MG tablet Commonly known as:  PERCOCET/ROXICET Take 1-2 tablets by mouth every 6 (six) hours as needed for severe pain. What changed:  when to take this   promethazine 25 MG tablet Commonly known as:  PHENERGAN Take 1 tablet (25 mg total) by mouth every 6 (six) hours as needed for nausea or vomiting.      Follow-up Information    CONSTANT,PEGGY, MD .   Specialty:  Obstetrics and Gynecology Why:  As scheduled tomorrow at 2:00 pm. Return to  MAU as needed for emergencies. Contact information: [place] [address] [phone]           Fatima Blank Certified Nurse-Midwife 11/27/2015  8:07 PM

## 2015-11-28 ENCOUNTER — Ambulatory Visit (INDEPENDENT_AMBULATORY_CARE_PROVIDER_SITE_OTHER): Payer: Medicaid Other | Admitting: Obstetrics and Gynecology

## 2015-11-28 ENCOUNTER — Encounter: Payer: Self-pay | Admitting: Obstetrics and Gynecology

## 2015-11-28 VITALS — BP 119/78 | HR 85 | Wt 151.3 lb

## 2015-11-28 DIAGNOSIS — R102 Pelvic and perineal pain: Secondary | ICD-10-CM

## 2015-11-28 DIAGNOSIS — Z9889 Other specified postprocedural states: Secondary | ICD-10-CM

## 2015-11-28 MED ORDER — OXYCODONE-ACETAMINOPHEN 5-325 MG PO TABS: 1.0000 | ORAL_TABLET | Freq: Four times a day (QID) | ORAL | 0 refills | Status: DC | PRN

## 2015-11-28 NOTE — Progress Notes (Signed)
40 yo presenting today for post op check. Patient underwent a lsc lysis of adhesions on 10/10. She reports feeling well up until 4 days ago. She was passing flatus, had regular bowel movement. On Monday, she reports a decrease in appetite. She reports onset of left lower quadrant pain similar to her pre-op pain. She reports some nausea but no emesis. She denies, fever, chills, or abnormal drainage from her incision  Past Medical History:  Diagnosis Date  . Anxiety   . Arthritis   . Asthma   . GERD (gastroesophageal reflux disease)   . Pneumonia   . PONV (postoperative nausea and vomiting)   . Scoliosis    Past Surgical History:  Procedure Laterality Date  . ABDOMINAL HYSTERECTOMY    . ABDOMINOPLASTY    . APPENDECTOMY    . CESAREAN SECTION    . CHOLECYSTECTOMY    . HERNIA REPAIR    . LAPAROSCOPY  11/12/2015   Procedure: LAPAROSCOPY DIAGNOSTIC;  Surgeon: Mora Bellman, MD;  Location: [facility];  Service: Gynecology;;  . LYSIS OF ADHESION  11/12/2015   Procedure: LYSIS OF ADHESION;  Surgeon: Mora Bellman, MD;  Location: [facility];  Service: Gynecology;;   No family history on file. Social History  Substance Use Topics  . Smoking status: Current Some Day Smoker    Packs/day: 2.00    Years: 8.00    Types: Cigarettes  . Smokeless tobacco: Never Used  . Alcohol use Yes   ROS See pertinent in HPI  Blood pressure 119/78, pulse 85, weight 151 lb 4.8 oz (68.6 kg). GENERAL: Well-developed, well-nourished female in no acute distress.  HEENT: Normocephalic, atraumatic. Sclerae anicteric.  NECK: Supple. Normal thyroid.  LUNGS: Clear to auscultation bilaterally.  HEART: Regular rate and rhythm. BREASTS: Symmetric in size. No palpable masses or lymphadenopathy, skin changes, or nipple drainage. ABDOMEN: Soft, nondistended. Tenderness in left lower quadrant Incisions: healing well. No erythema, induration or drainage EXTREMITIES: No cyanosis, clubbing, or edema, 2+ distal pulses.  A/P 40  yo here for post op check - CT abd/pelvis ordered - Discussed surgical finding. Discussed bowel adhesions noted. Some lysis of adhesion was performed but some bowel involvement was still left behind as to avoid bowel perforation - patient will be contacted with results - Rx percocet provided - Pain clinic referral provided - Gen surgery referral possibly in the future pending improvement in her pain or CT findings

## 2015-11-28 NOTE — Progress Notes (Signed)
Pt reports vomiting since Tuesday and nausea medication not effective.  Pt states that she went to MAU last night.

## 2015-11-28 NOTE — Progress Notes (Addendum)
CT scan scheduled for October 30th @ 1545.  Pt informed to be NPO 4 hours prior to appt.  Pt taken to Radiology to receive contrast and instructions on how to take.  Pt stated understanding with no further questions.

## 2015-12-02 ENCOUNTER — Ambulatory Visit (HOSPITAL_COMMUNITY): Payer: Medicaid Other

## 2015-12-06 ENCOUNTER — Ambulatory Visit (HOSPITAL_COMMUNITY): Payer: Medicaid Other

## 2015-12-10 ENCOUNTER — Ambulatory Visit (HOSPITAL_COMMUNITY)
Admission: RE | Admit: 2015-12-10 | Discharge: 2015-12-10 | Disposition: A | Discharge status: Home / Self Care | Payer: Medicaid Other | Source: Ambulatory Visit | Attending: Obstetrics and Gynecology | Admitting: Obstetrics and Gynecology

## 2015-12-10 DIAGNOSIS — N9489 Other specified conditions associated with female genital organs and menstrual cycle: Secondary | ICD-10-CM | POA: Diagnosis not present

## 2015-12-10 DIAGNOSIS — R102 Pelvic and perineal pain: Secondary | ICD-10-CM | POA: Diagnosis present

## 2015-12-10 DIAGNOSIS — N1330 Unspecified hydronephrosis: Secondary | ICD-10-CM | POA: Diagnosis not present

## 2015-12-10 DIAGNOSIS — N200 Calculus of kidney: Secondary | ICD-10-CM | POA: Insufficient documentation

## 2015-12-10 MED ORDER — IOPAMIDOL (ISOVUE-300) INJECTION 61%
100.0000 mL | Freq: Once | INTRAVENOUS | Status: AC | PRN
  Administered 2015-12-10: 100 mL via INTRAVENOUS

## 2015-12-13 ENCOUNTER — Telehealth: Payer: Self-pay | Admitting: *Deleted

## 2015-12-13 NOTE — Telephone Encounter (Signed)
Called patient to inform her of her appointment with Dr. Denman George on 01/01/16 at [facility]. Pt voiced understanding but stated that she is worried that she may have cancer since we are sending her to oncology. Gave reassurance and patient had no further questions or concerns. She agrees to keep this appointment.

## 2015-12-17 ENCOUNTER — Telehealth: Payer: Self-pay | Admitting: *Deleted

## 2015-12-17 NOTE — Telephone Encounter (Signed)
Pt called back and was transferred to me. I discussed with her that Dr. 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 Pt voiced understanding and had no further questions or concerns.

## 2015-12-17 NOTE — Telephone Encounter (Signed)
Pt left message yesterday stating that she is still having some pain and wants to know if a prescription can be called to her pharmacy. Also she has questions about her CT scan results and why we are referring her to the [facility].

## 2016-01-01 ENCOUNTER — Ambulatory Visit: Payer: Medicaid Other | Attending: Gynecologic Oncology | Admitting: Gynecologic Oncology

## 2016-01-01 ENCOUNTER — Encounter: Payer: Self-pay | Admitting: Gynecologic Oncology

## 2016-01-01 VITALS — BP 145/85 | HR 79 | Temp 98.8°F | Resp 19

## 2016-01-01 DIAGNOSIS — N9489 Other specified conditions associated with female genital organs and menstrual cycle: Secondary | ICD-10-CM

## 2016-01-01 DIAGNOSIS — N83202 Unspecified ovarian cyst, left side: Secondary | ICD-10-CM | POA: Diagnosis not present

## 2016-01-01 DIAGNOSIS — M1990 Unspecified osteoarthritis, unspecified site: Secondary | ICD-10-CM | POA: Diagnosis not present

## 2016-01-01 DIAGNOSIS — K219 Gastro-esophageal reflux disease without esophagitis: Secondary | ICD-10-CM | POA: Diagnosis not present

## 2016-01-01 DIAGNOSIS — F17210 Nicotine dependence, cigarettes, uncomplicated: Secondary | ICD-10-CM | POA: Diagnosis not present

## 2016-01-01 DIAGNOSIS — Z90710 Acquired absence of both cervix and uterus: Secondary | ICD-10-CM | POA: Insufficient documentation

## 2016-01-01 DIAGNOSIS — M419 Scoliosis, unspecified: Secondary | ICD-10-CM | POA: Insufficient documentation

## 2016-01-01 DIAGNOSIS — N736 Female pelvic peritoneal adhesions (postinfective): Secondary | ICD-10-CM | POA: Diagnosis not present

## 2016-01-01 DIAGNOSIS — F419 Anxiety disorder, unspecified: Secondary | ICD-10-CM | POA: Diagnosis not present

## 2016-01-01 DIAGNOSIS — J45909 Unspecified asthma, uncomplicated: Secondary | ICD-10-CM | POA: Insufficient documentation

## 2016-01-01 MED ORDER — SENNA 8.6 MG PO TABS: 1.0000 | ORAL_TABLET | ORAL | 0 refills | Status: DC

## 2016-01-01 MED ORDER — OXYCODONE HCL 5 MG PO TABS: 5.0000 mg | ORAL_TABLET | ORAL | 0 refills | Status: DC | PRN

## 2016-01-01 NOTE — Patient Instructions (Signed)
Preparing for your Surgery  Plan for surgery on January 28, 2016 with Dr. Everitt Amber at [facility] will be scheduled for a robotic assisted left salpingo-oophorectomy, lysis of adhesions, possible bilateral salpingo-oophorectomy, possible staging, possible laparotomy (open incision), possible bowel resection.    Pre-operative Testing -You will receive a phone call from presurgical testing at [facility] to arrange for a pre-operative testing appointment before your surgery.  This appointment normally occurs one to two weeks before your scheduled surgery.   -Bring your insurance card, copy of an advanced directive if applicable, medication list  -At that visit, you will be asked to sign a consent for a possible blood transfusion in case a transfusion becomes necessary during surgery.  The need for a blood transfusion is rare but having consent is a necessary part of your care.     -You should not be taking blood thinners or aspirin at least ten days prior to surgery unless instructed by your surgeon.  -Drink two bottles of magnesium citrate the day before surgery starting at 9 am to clean out your bowels.  Day Before Surgery at [place] will be asked to take in a light diet the day before surgery.  Avoid carbonated beverages.  You will be advised to have nothing to eat or drink after midnight the evening before.     Eat a light diet the day before surgery.  Examples including soups, broths, toast, yogurt, mashed potatoes.  Things to avoid include carbonated beverages (fizzy beverages), raw fruits and raw vegetables, or beans.    If your bowels are filled with gas, your surgeon will have difficulty visualizing your pelvic organs which increases your surgical risks.  Your role in recovery Your role is to become active as soon as directed by your doctor, while still giving yourself time to heal.  Rest when you feel tired. You will be asked to do the  following in order to speed your recovery:  - Cough and breathe deeply. This helps to clear and expand your lungs and can prevent pneumonia. You may be given a spirometer to practice deep breathing. A staff member will show you how to use the spirometer. - Do mild physical activity. Walking or moving your legs help your circulation and body functions return to normal. A staff member will help you when you try to walk and will provide you with simple exercises. Do not try to get up or walk alone the first time. - Actively manage your pain. Managing your pain lets you move in comfort. We will ask you to rate your pain on a scale of zero to 10. It is your responsibility to tell your doctor or nurse where and how much you hurt so your pain can be treated.  Special Considerations -If you are diabetic, you may be placed on insulin after surgery to have closer control over your blood sugars to promote healing and recovery.  This does not mean that you will be discharged on insulin.  If applicable, your oral antidiabetics will be resumed when you are tolerating a solid diet.  -Your final pathology results from surgery should be available by the Friday after surgery and the results will be relayed to you when available.   Blood Transfusion Information WHAT IS A BLOOD TRANSFUSION? A transfusion is the replacement of blood or some of its parts. Blood is made up of multiple cells which provide different functions.  Red blood cells carry oxygen and are used  for blood loss replacement.  White blood cells fight against infection.  Platelets control bleeding.  Plasma helps clot blood.  Other blood products are available for specialized needs, such as hemophilia or other clotting disorders. BEFORE THE TRANSFUSION  Who gives blood for transfusions?   You may be able to donate blood to be used at a later date on yourself (autologous donation).  Relatives can be asked to donate blood. This is generally not  any safer than if you have received blood from a stranger. The same precautions are taken to ensure safety when a relative's blood is donated.  Healthy volunteers who are fully evaluated to make sure their blood is safe. This is blood bank blood. Transfusion therapy is the safest it has ever been in the practice of medicine. Before blood is taken from a donor, a complete history is taken to make sure that person has no history of diseases nor engages in risky social behavior (examples are intravenous drug use or sexual activity with multiple partners). The donor's travel history is screened to minimize risk of transmitting infections, such as malaria. The donated blood is tested for signs of infectious diseases, such as HIV and hepatitis. The blood is then tested to be sure it is compatible with you in order to minimize the chance of a transfusion reaction. If you or a relative donates blood, this is often done in anticipation of surgery and is not appropriate for emergency situations. It takes many days to process the donated blood. RISKS AND COMPLICATIONS Although transfusion therapy is very safe and saves many lives, the main dangers of transfusion include:   Getting an infectious disease.  Developing a transfusion reaction. This is an allergic reaction to something in the blood you were given. Every precaution is taken to prevent this. The decision to have a blood transfusion has been considered carefully by your caregiver before blood is given. Blood is not given unless the benefits outweigh the risks.

## 2016-01-02 NOTE — Progress Notes (Signed)
Consult Note: Gyn-Onc  Consult was requested by Dr. Elly Modena for the evaluation of Sara Pratt 40 y.o. female  CC:  Chief Complaint  Patient presents with  . left ovarian mass    Assessment/Plan:  Sara Pratt  is a 40 y.o.  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  I discussed with Sara Pratt that I have a low suspicion that this mass is malignant given its features on imaging and her normal tumor marker profile. However, it is very symptomatic and therefore I recommend its removal.  She has substantial adhesions between bowel and the ovary. She may require bowel resection during the procedure in order to facilitate ovarian removal.  I am recommending robotic assisted left salpingo-oophorectomy, lysis of adhesions, possible laparotomy.  I discussed that the surgery is associated with risks including  bleeding, infection, damage to internal organs (such as bladder,ureters, bowels), blood clot, reoperation and rehospitalization. I discussed that the risk of GI injury is particularly high for her. I discussed that if small or large bowel resection and reanastamosis is required, she will be at increased risk for leak, stricture or formation of diverting stoma.  She is also at increased risk for infection if GI surgery is performed.  She will take preoperative bowel prep and entereg and will be scheduled for surgery in December.   HPI: Sara Pratt is a 40 year old woman who is seen in consultation at the request of Dr Elly Modena for a left ovarian mass.  The patient reports having abdominal pain for approximately 3 months. An MRI of the pelvis was performed on 10/30/15 as her pain was initially in her back and her provider though this might be a spine issue. The scan showed a 7.3cm cystic mass with an internal septation and small mural nodule arising from the left ovary. She was seen and evaluated with CT abdo/pelvis on 12/10/15 which confirmed  the cystic mass measuring 9x7.1x8.6cm. No ascites/carcinomatosis/nodularity/adenopathy was identified.  CA 125 on 11/04/15 was normal at 7.  She was taken to the OR by Dr Elly Modena on 11/12/15 for a planned LSO, however, intraoperative adhesions between the bowel and ovary were so substantial that the ovary could not be visualized and the surgery was aborted.  The patient has a history of multiple medical procedures including 5 cesarean sections. Her last pregnancy was complicated by uterine rupture (and fetal demise) in the "9th month" and she required an emergent hysterectomy. She has also had an umbilical hernia repair, appendectomy and cholecystectomy. She has had a prior abdominoplasty.  She is an active smoker (2 packs per day).   Current Meds:  Outpatient Encounter Prescriptions as of 01/01/2016  Medication Sig  . docusate sodium (COLACE) 100 MG capsule Take 1 capsule (100 mg total) by mouth 2 (two) times daily as needed.  . hydrOXYzine (ATARAX/VISTARIL) 25 MG tablet Take 25 mg by mouth 3 (three) times daily as needed for anxiety.  . naproxen (NAPROSYN) 500 MG tablet Take 500 mg by mouth 2 (two) times daily as needed for mild pain.  Marland Kitchen ondansetron (ZOFRAN ODT) 4 MG disintegrating tablet Take 1 tablet (4 mg total) by mouth every 8 (eight) hours as needed for nausea or vomiting.  Marland Kitchen oxyCODONE-acetaminophen (PERCOCET/ROXICET) 5-325 MG tablet Take 1-2 tablets by mouth every 6 (six) hours as needed for severe pain.  . promethazine (PHENERGAN) 25 MG tablet Take 1 tablet (25 mg total) by mouth every 6 (six) hours as needed for  nausea or vomiting.  . [DISCONTINUED] predniSONE (DELTASONE) 5 MG tablet Take 5 mg by mouth daily with breakfast.  . oxyCODONE (ROXICODONE) 5 MG immediate release tablet Take 1 tablet (5 mg total) by mouth every 4 (four) hours as needed for severe pain.  Marland Kitchen senna (SENOKOT) 8.6 MG TABS tablet Take 1 tablet (8.6 mg total) by mouth 30 (thirty) minutes before procedure.   No  facility-administered encounter medications on file as of 01/01/2016.     Allergy: No Known Allergies  Social Hx:   Social History   Social History  . Marital status: Divorced    Spouse name: N/A  . Number of children: N/A  . Years of education: N/A   Occupational History  . Not on file.   Social History Main Topics  . Smoking status: Current Some Day Smoker    Packs/day: 2.00    Years: 8.00    Types: Cigarettes  . Smokeless tobacco: Never Used  . Alcohol use Yes     Comment: Wine/ , liquor  "depends on the day"  . Drug use: No  . Sexual activity: Yes    Birth control/ protection: None, Surgical   Other Topics Concern  . Not on file   Social History Narrative  . No narrative on file    Past Surgical Hx:  Past Surgical History:  Procedure Laterality Date  . ABDOMINAL HYSTERECTOMY    . ABDOMINOPLASTY    . APPENDECTOMY    . CESAREAN SECTION    . CHOLECYSTECTOMY    . HERNIA REPAIR    . LAPAROSCOPY  11/12/2015   Procedure: LAPAROSCOPY DIAGNOSTIC;  Surgeon: Mora Bellman, MD;  Location: [facility];  Service: Gynecology;;  . LYSIS OF ADHESION  11/12/2015   Procedure: LYSIS OF ADHESION;  Surgeon: Mora Bellman, MD;  Location: [facility];  Service: Gynecology;;    Past Medical Hx:  Past Medical History:  Diagnosis Date  . Anxiety   . Arthritis   . Asthma   . GERD (gastroesophageal reflux disease)   . Pneumonia   . PONV (postoperative nausea and vomiting)   . Scoliosis     Past Gynecological History:  5 cesarean sections. Emergent hyst for ruptured uterus. No LMP recorded. Patient has had a hysterectomy.  Family Hx: History reviewed. No pertinent family history.  Review of Systems:  Constitutional  Feels well,    ENT Normal appearing ears and nares bilaterally Skin/Breast  No rash, sores, jaundice, itching, dryness Cardiovascular  No chest pain, shortness of breath, or edema  Pulmonary  No cough or wheeze.  Gastro Intestinal  No nausea, vomitting, or  diarrhoea. No bright red blood per rectum, +abdominal pain, no change in bowel movement, or constipation.  Genito Urinary  No frequency, urgency, dysuria, no bleeding Musculo Skeletal  No myalgia, arthralgia, joint swelling or pain  Neurologic  No weakness, numbness, change in gait,  Psychology  No depression, anxiety, insomnia.   Vitals:  Blood pressure (!) 145/85, pulse 79, temperature 98.8 F (37.1 C), temperature source Oral, resp. rate 19.  Physical Exam: WD in NAD Neck  Supple NROM, without any enlargements.  Lymph Node Survey No cervical supraclavicular or inguinal adenopathy Cardiovascular  Pulse normal rate, regularity and rhythm. S1 and S2 normal.  Lungs  Clear to auscultation bilateraly, without wheezes/crackles/rhonchi. Good air movement.  Skin  No rash/lesions/breakdown  Psychiatry  Alert and oriented to person, place, and time  Abdomen  Normoactive bowel sounds, abdomen soft, non-tender and thin without evidence of hernia. Tenderness  in left lower quadrant to palpation Back No CVA tenderness Genito Urinary  Vulva/vagina: Normal external female genitalia.  No lesions. No discharge or bleeding.  Bladder/urethra:  No lesions or masses, well supported bladder  Vagina: normal  Cervix: palpable residual fragment of cervix.  Uterus: surgically absent   Adnexa: Cystic, smooth mass appreciated in the pelvis.   Rectal  deferred Extremities  No bilateral cyanosis, clubbing or edema.   Donaciano Eva, MD  01/02/2016, 5:59 PM

## 2016-01-22 ENCOUNTER — Encounter (HOSPITAL_COMMUNITY): Admission: RE | Admit: 2016-01-22 | Payer: Medicaid Other | Source: Ambulatory Visit

## 2016-01-30 NOTE — Progress Notes (Signed)
This encounter was created in error - please disregard.

## 2016-02-14 ENCOUNTER — Other Ambulatory Visit: Payer: Self-pay | Admitting: Gynecologic Oncology

## 2016-02-14 DIAGNOSIS — N9489 Other specified conditions associated with female genital organs and menstrual cycle: Secondary | ICD-10-CM

## 2016-02-14 MED ORDER — OXYCODONE HCL 5 MG PO TABS
5.0000 mg | ORAL_TABLET | ORAL | 0 refills | Status: DC | PRN
Start: 2016-02-14 — End: 2016-03-03

## 2016-02-14 NOTE — Patient Instructions (Addendum)
Sara Pratt  02/14/2016   Your procedure is scheduled on: Thursday 02/20/2016  Report to [facility] Main  Entrance take [place]  elevators to 3rd floor to  [place] at  1130 AM.  Call this number if you have problems the morning of surgery [phone]   Remember: ONLY 1 PERSON MAY GO WITH YOU TO SHORT STAY TO GET  READY MORNING OF [place].                           FOLLOW BOWEL PREP INSTRUCTIONS FROM DR. ROSSI THE DAY BEFORE SURGERY ON Wednesday  02/19/2016!               DRINK TWO BOTTLES OF MAGNESIUM CITRATE THE DAY BEFORE SURGERY STARTING AT 9 OR 10 AM!              Eat a light diet the day before surgery.  Examples including soups, broths, toast, yogurt, mashed potatoes.  Things to avoid include carbonated beverages (fizzy beverages), raw fruits and raw vegetables, or beans.   If your bowels are filled with gas, your surgeon will have difficulty visualizing your pelvic organs which increases your surgical risks.     Do not eat food  :After Midnight.  YOU MAY HAVE CLEAR LIQUIDS ONLY FROM MIDNIGHT UP UNTIL 0800 AM ,MORNING OF SURGERY THEN NOTHING UNTIL AFTER SURGERY!     CLEAR LIQUID DIET   Foods Allowed                                                                     Foods Excluded  Coffee and tea, regular and decaf                             liquids that you cannot  Plain Jell-O in any flavor                                             see through such as: Fruit ices (not with fruit pulp)                                     milk, soups, orange juice  Iced Popsicles                                    All solid food Carbonated beverages, regular and diet                                    Cranberry, grape and apple juices Sports drinks like Gatorade Lightly seasoned clear broth or consume(fat free) Sugar, honey syrup  Sample Menu Breakfast  Lunch                                     Supper Cranberry  juice                    Beef broth                            Chicken broth Jell-O                                     Grape juice                           Apple juice Coffee or tea                        Jell-O                                      Popsicle                                                Coffee or tea                        Coffee or tea  _____________________________________________________________________     Take these medicines the morning of surgery with A SIP OF WATER: Bupropion (Wellbuterin XL), Oxycodone if needed                                 You may not have any metal on your body including hair pins and              piercings  Do not wear jewelry, make-up, lotions, powders or perfumes, deodorant             Do not wear nail polish.  Do not shave  48 hours prior to surgery.              Men may shave face and neck.   Do not bring valuables to the hospital. [place].  Contacts, dentures or bridgework may not be worn into surgery.  Leave suitcase in the car. After surgery it may be brought to your room.                  Please read over the following fact sheets you were given: _____________________________________________________________________             [facility] - Preparing for Surgery Before surgery, you can play an important role.  Because skin is not sterile, your skin needs to be as free of germs as possible.  You can reduce the number of germs on your skin by washing with CHG (chlorahexidine gluconate) soap before surgery.  CHG is an antiseptic cleaner which kills germs and bonds with the skin  to continue killing germs even after washing. Please DO NOT use if you have an allergy to CHG or antibacterial soaps.  If your skin becomes reddened/irritated stop using the CHG and inform your nurse when you arrive at Short Stay. Do not shave (including legs and underarms) for at least 48 hours prior to  the first CHG shower.  You may shave your face/neck. Please follow these instructions carefully:  1.  Shower with CHG Soap the night before surgery and the  morning of Surgery.  2.  If you choose to wash your hair, wash your hair first as usual with your  normal  shampoo.  3.  After you shampoo, rinse your hair and body thoroughly to remove the  shampoo.                           4.  Use CHG as you would any other liquid soap.  You can apply chg directly  to the skin and wash                       Gently with a scrungie or clean washcloth.  5.  Apply the CHG Soap to your body ONLY FROM THE NECK DOWN.   Do not use on face/ open                           Wound or open sores. Avoid contact with eyes, ears mouth and genitals (private parts).                       Wash face,  Genitals (private parts) with your normal soap.             6.  Wash thoroughly, paying special attention to the area where your surgery  will be performed.  7.  Thoroughly rinse your body with warm water from the neck down.  8.  DO NOT shower/wash with your normal soap after using and rinsing off  the CHG Soap.                9.  Pat yourself dry with a clean towel.            10.  Wear clean pajamas.            11.  Place clean sheets on your bed the night of your first shower and do not  sleep with pets. Day of Surgery : Do not apply any lotions/deodorants the morning of surgery.  Please wear clean clothes to the hospital/surgery center.  FAILURE TO FOLLOW THESE INSTRUCTIONS MAY RESULT IN THE CANCELLATION OF YOUR SURGERY PATIENT SIGNATURE_________________________________  NURSE SIGNATURE__________________________________  ________________________________________________________________________

## 2016-02-17 ENCOUNTER — Other Ambulatory Visit: Payer: Self-pay

## 2016-02-17 ENCOUNTER — Encounter (HOSPITAL_COMMUNITY): Payer: Self-pay

## 2016-02-17 ENCOUNTER — Encounter (HOSPITAL_COMMUNITY)
Admission: RE | Admit: 2016-02-17 | Discharge: 2016-02-17 | Disposition: A | Discharge status: Home / Self Care | Payer: Medicaid Other | Source: Ambulatory Visit | Attending: Gynecologic Oncology | Admitting: Gynecologic Oncology

## 2016-02-17 DIAGNOSIS — N949 Unspecified condition associated with female genital organs and menstrual cycle: Secondary | ICD-10-CM | POA: Diagnosis not present

## 2016-02-17 DIAGNOSIS — R112 Nausea with vomiting, unspecified: Secondary | ICD-10-CM

## 2016-02-17 DIAGNOSIS — F419 Anxiety disorder, unspecified: Secondary | ICD-10-CM | POA: Diagnosis not present

## 2016-02-17 DIAGNOSIS — Z01812 Encounter for preprocedural laboratory examination: Secondary | ICD-10-CM | POA: Diagnosis present

## 2016-02-17 DIAGNOSIS — N9489 Other specified conditions associated with female genital organs and menstrual cycle: Secondary | ICD-10-CM

## 2016-02-17 DIAGNOSIS — K660 Peritoneal adhesions (postprocedural) (postinfection): Secondary | ICD-10-CM | POA: Diagnosis not present

## 2016-02-17 LAB — URINALYSIS, ROUTINE W REFLEX MICROSCOPIC
BILIRUBIN URINE: NEGATIVE
Glucose, UA: NEGATIVE mg/dL
Hgb urine dipstick: NEGATIVE
Ketones, ur: NEGATIVE mg/dL
LEUKOCYTES UA: NEGATIVE
NITRITE: NEGATIVE
PH: 6 (ref 5.0–8.0)
Protein, ur: NEGATIVE mg/dL
SPECIFIC GRAVITY, URINE: 1.006 (ref 1.005–1.030)

## 2016-02-17 LAB — COMPREHENSIVE METABOLIC PANEL
ALT: 12 U/L — AB (ref 14–54)
AST: 14 U/L — ABNORMAL LOW (ref 15–41)
Albumin: 3.8 g/dL (ref 3.5–5.0)
Alkaline Phosphatase: 29 U/L — ABNORMAL LOW (ref 38–126)
Anion gap: 6 (ref 5–15)
BUN: 10 mg/dL (ref 6–20)
CHLORIDE: 105 mmol/L (ref 101–111)
CO2: 27 mmol/L (ref 22–32)
CREATININE: 0.77 mg/dL (ref 0.44–1.00)
Calcium: 8.6 mg/dL — ABNORMAL LOW (ref 8.9–10.3)
GFR calc non Af Amer: >60 mL/min (ref >60)
Glucose, Bld: 93 mg/dL (ref 65–99)
POTASSIUM: 3.6 mmol/L (ref 3.5–5.1)
SODIUM: 138 mmol/L (ref 135–145)
Total Bilirubin: 0.4 mg/dL (ref 0.3–1.2)
Total Protein: 6.6 g/dL (ref 6.5–8.1)

## 2016-02-17 LAB — CBC WITH DIFFERENTIAL/PLATELET
BASOS ABS: 0 10*3/uL (ref 0.0–0.1)
Basophils Relative: 0 %
EOS ABS: 0.1 10*3/uL (ref 0.0–0.7)
EOS PCT: 1 %
HCT: 36.9 % (ref 36.0–46.0)
Hemoglobin: 12.9 g/dL (ref 12.0–15.0)
LYMPHS ABS: 2.1 10*3/uL (ref 0.7–4.0)
Lymphocytes Relative: 37 %
MCH: 27.7 pg (ref 26.0–34.0)
MCHC: 35 g/dL (ref 30.0–36.0)
MCV: 79.4 fL (ref 78.0–100.0)
MONO ABS: 0.5 10*3/uL (ref 0.1–1.0)
Monocytes Relative: 9 %
Neutro Abs: 2.9 10*3/uL (ref 1.7–7.7)
Neutrophils Relative %: 53 %
PLATELETS: 124 10*3/uL — AB (ref 150–400)
RBC: 4.65 MIL/uL (ref 3.87–5.11)
RDW: 14.3 % (ref 11.5–15.5)
WBC: 5.6 10*3/uL (ref 4.0–10.5)

## 2016-02-17 LAB — NO BLOOD PRODUCTS

## 2016-02-17 MED ORDER — ONDANSETRON HCL 8 MG PO TABS: 4.0000 mg | ORAL_TABLET | Freq: Three times a day (TID) | ORAL | 0 refills | Status: DC | PRN

## 2016-02-17 MED ORDER — ONDANSETRON 4 MG PO TBDP: 4.0000 mg | ORAL_TABLET | Freq: Three times a day (TID) | ORAL | 0 refills | Status: DC | PRN

## 2016-02-17 NOTE — Progress Notes (Signed)
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

## 2016-02-18 ENCOUNTER — Telehealth: Payer: Self-pay | Admitting: Gynecologic Oncology

## 2016-02-18 NOTE — Telephone Encounter (Signed)
Patient called with multiple questions about her pre-op labs that were released in [place].  All questions answered.  Advised to call for any questions or concerns.

## 2016-02-19 ENCOUNTER — Telehealth: Payer: Self-pay | Admitting: Gynecologic Oncology

## 2016-02-19 NOTE — Telephone Encounter (Signed)
Returned call to patient.  Patient had called this am stating she had not picked up her bowel prep, Mag citrate, and she did not think she could get out to pick it up.  Advised patient that we would touch base with Dr. Denman George for further recommendations.   Per Dr. Denman George, the patient's surgery would have to be rescheduled because if she had to do a bowel resection, the patient would likely end up with a colostomy if she had stool in the colon.  Called patient back and discussed above.  She is wanting now to move her surgery to Jan 30.  Spoke with [place] in the OR about patient moving her surgical date.  Dr. Denman George is aware.  Pre-surgical notified as well.  Patient advised to call for any needs or concerns.

## 2016-03-02 ENCOUNTER — Telehealth: Payer: Self-pay

## 2016-03-02 NOTE — Telephone Encounter (Signed)
Verified with patient that she has started Mag Ctirate bowel prep this am, to prepare for her surgery on 03/03/16. Pt stated that she had taken the 1st bottle. Verified understanding of information.

## 2016-03-03 ENCOUNTER — Ambulatory Visit (HOSPITAL_COMMUNITY): Payer: Medicaid Other | Admitting: Anesthesiology

## 2016-03-03 ENCOUNTER — Encounter (HOSPITAL_COMMUNITY): Payer: Self-pay

## 2016-03-03 ENCOUNTER — Ambulatory Visit (HOSPITAL_COMMUNITY)
Admission: RE | Admit: 2016-03-03 | Discharge: 2016-03-03 | Disposition: A | Discharge status: Home / Self Care | Payer: Medicaid Other | Source: Ambulatory Visit | Attending: Gynecologic Oncology | Admitting: Gynecologic Oncology

## 2016-03-03 ENCOUNTER — Encounter (HOSPITAL_COMMUNITY)
Admission: RE | Disposition: A | Discharge status: Home / Self Care | Payer: Self-pay | Source: Ambulatory Visit | Attending: Gynecologic Oncology

## 2016-03-03 ENCOUNTER — Other Ambulatory Visit: Payer: Self-pay | Admitting: Gynecologic Oncology

## 2016-03-03 DIAGNOSIS — D271 Benign neoplasm of left ovary: Secondary | ICD-10-CM | POA: Insufficient documentation

## 2016-03-03 DIAGNOSIS — F17210 Nicotine dependence, cigarettes, uncomplicated: Secondary | ICD-10-CM | POA: Insufficient documentation

## 2016-03-03 DIAGNOSIS — N736 Female pelvic peritoneal adhesions (postinfective): Secondary | ICD-10-CM | POA: Diagnosis not present

## 2016-03-03 DIAGNOSIS — J45909 Unspecified asthma, uncomplicated: Secondary | ICD-10-CM | POA: Insufficient documentation

## 2016-03-03 DIAGNOSIS — F419 Anxiety disorder, unspecified: Secondary | ICD-10-CM | POA: Diagnosis not present

## 2016-03-03 DIAGNOSIS — M419 Scoliosis, unspecified: Secondary | ICD-10-CM | POA: Diagnosis not present

## 2016-03-03 DIAGNOSIS — K219 Gastro-esophageal reflux disease without esophagitis: Secondary | ICD-10-CM | POA: Insufficient documentation

## 2016-03-03 DIAGNOSIS — N9489 Other specified conditions associated with female genital organs and menstrual cycle: Secondary | ICD-10-CM

## 2016-03-03 DIAGNOSIS — M1990 Unspecified osteoarthritis, unspecified site: Secondary | ICD-10-CM | POA: Diagnosis not present

## 2016-03-03 DIAGNOSIS — Z9049 Acquired absence of other specified parts of digestive tract: Secondary | ICD-10-CM | POA: Diagnosis not present

## 2016-03-03 DIAGNOSIS — N83202 Unspecified ovarian cyst, left side: Secondary | ICD-10-CM | POA: Diagnosis not present

## 2016-03-03 HISTORY — PX: LYSIS OF ADHESION: SHX5961

## 2016-03-03 HISTORY — PX: ROBOTIC ASSISTED BILATERAL SALPINGO OOPHERECTOMY: SHX6078

## 2016-03-03 SURGERY — SALPINGO-OOPHORECTOMY, BILATERAL, ROBOT-ASSISTED
Anesthesia: General

## 2016-03-03 MED ORDER — MIDAZOLAM HCL 5 MG/5ML IJ SOLN
INTRAMUSCULAR | Status: DC | PRN
  Administered 2016-03-03: 2 mg via INTRAVENOUS

## 2016-03-03 MED ORDER — SODIUM CHLORIDE 0.9% FLUSH: 3.0000 mL | INTRAVENOUS | Status: DC | PRN

## 2016-03-03 MED ORDER — OXYCODONE HCL 5 MG PO TABS: 5.0000 mg | ORAL_TABLET | ORAL | Status: DC | PRN

## 2016-03-03 MED ORDER — SODIUM CHLORIDE 0.9% FLUSH: 3.0000 mL | Freq: Two times a day (BID) | INTRAVENOUS | Status: DC

## 2016-03-03 MED ORDER — HYDROMORPHONE HCL 2 MG/ML IJ SOLN
INTRAMUSCULAR | Status: AC
  Filled 2016-03-03: qty 1

## 2016-03-03 MED ORDER — MIDAZOLAM HCL 2 MG/2ML IJ SOLN
INTRAMUSCULAR | Status: AC
  Filled 2016-03-03: qty 2

## 2016-03-03 MED ORDER — HYDROMORPHONE BOLUS VIA INFUSION
0.5000 mg | INTRAVENOUS | Status: DC | PRN
  Filled 2016-03-03: qty 1

## 2016-03-03 MED ORDER — CEFOXITIN SODIUM 2 G IV SOLR
INTRAVENOUS | Status: AC
  Filled 2016-03-03: qty 2

## 2016-03-03 MED ORDER — DEXTROSE 5 % IV SOLN
2.0000 g | INTRAVENOUS | Status: AC
  Administered 2016-03-03: 2 g via INTRAVENOUS

## 2016-03-03 MED ORDER — MEPERIDINE HCL 50 MG/ML IJ SOLN: 6.2500 mg | INTRAMUSCULAR | Status: DC | PRN

## 2016-03-03 MED ORDER — OXYCODONE-ACETAMINOPHEN 5-325 MG PO TABS: 1.0000 | ORAL_TABLET | ORAL | 0 refills | Status: DC | PRN

## 2016-03-03 MED ORDER — LACTATED RINGERS IV SOLN
INTRAVENOUS | Status: DC
  Administered 2016-03-03: 1000 mL via INTRAVENOUS
  Administered 2016-03-03: 13:00:00 via INTRAVENOUS

## 2016-03-03 MED ORDER — HYDROMORPHONE HCL 1 MG/ML IJ SOLN
0.2500 mg | INTRAMUSCULAR | Status: DC | PRN
  Administered 2016-03-03 (×3): 0.5 mg via INTRAVENOUS

## 2016-03-03 MED ORDER — PROPOFOL 10 MG/ML IV BOLUS
INTRAVENOUS | Status: DC | PRN
Start: 2016-03-03 — End: 2016-03-03
  Administered 2016-03-03: 200 mg via INTRAVENOUS

## 2016-03-03 MED ORDER — BUPIVACAINE HCL (PF) 0.25 % IJ SOLN
INTRAMUSCULAR | Status: DC | PRN
  Administered 2016-03-03: 18 mL

## 2016-03-03 MED ORDER — ACETAMINOPHEN 650 MG RE SUPP
650.0000 mg | RECTAL | Status: DC | PRN
  Filled 2016-03-03: qty 1

## 2016-03-03 MED ORDER — SUCCINYLCHOLINE CHLORIDE 200 MG/10ML IV SOSY
PREFILLED_SYRINGE | INTRAVENOUS | Status: DC | PRN
  Administered 2016-03-03: 120 mg via INTRAVENOUS

## 2016-03-03 MED ORDER — PROPOFOL 10 MG/ML IV BOLUS
INTRAVENOUS | Status: AC
  Filled 2016-03-03: qty 20

## 2016-03-03 MED ORDER — DOCUSATE SODIUM 50 MG PO CAPS: 50.0000 mg | ORAL_CAPSULE | Freq: Two times a day (BID) | ORAL | 0 refills | Status: DC

## 2016-03-03 MED ORDER — DEXTROSE 5 % IV SOLN
INTRAVENOUS | Status: AC
  Filled 2016-03-03: qty 50

## 2016-03-03 MED ORDER — ROCURONIUM BROMIDE 50 MG/5ML IV SOSY
PREFILLED_SYRINGE | INTRAVENOUS | Status: AC
  Filled 2016-03-03: qty 5

## 2016-03-03 MED ORDER — SENNOSIDES-DOCUSATE SODIUM 8.6-50 MG PO TABS: 1.0000 | ORAL_TABLET | Freq: Every evening | ORAL | 0 refills | Status: DC | PRN

## 2016-03-03 MED ORDER — SODIUM CHLORIDE 0.9 % IV SOLN: 250.0000 mL | INTRAVENOUS | Status: DC | PRN

## 2016-03-03 MED ORDER — ONDANSETRON HCL 4 MG/2ML IJ SOLN
INTRAMUSCULAR | Status: AC
  Filled 2016-03-03: qty 2

## 2016-03-03 MED ORDER — FENTANYL CITRATE (PF) 100 MCG/2ML IJ SOLN
INTRAMUSCULAR | Status: AC
  Filled 2016-03-03: qty 2

## 2016-03-03 MED ORDER — STERILE WATER FOR IRRIGATION IR SOLN
Status: DC | PRN
  Administered 2016-03-03: 1000 mL

## 2016-03-03 MED ORDER — ACETAMINOPHEN 325 MG PO TABS
650.0000 mg | ORAL_TABLET | ORAL | Status: DC | PRN
  Administered 2016-03-03: 650 mg via ORAL
  Filled 2016-03-03: qty 2

## 2016-03-03 MED ORDER — SENNA 8.6 MG PO TABS: 1.0000 | ORAL_TABLET | Freq: Every day | ORAL | 0 refills | Status: DC

## 2016-03-03 MED ORDER — LIDOCAINE 2% (20 MG/ML) 5 ML SYRINGE
INTRAMUSCULAR | Status: DC | PRN
  Administered 2016-03-03: 60 mg via INTRAVENOUS

## 2016-03-03 MED ORDER — ENOXAPARIN SODIUM 40 MG/0.4ML ~~LOC~~ SOLN
40.0000 mg | SUBCUTANEOUS | Status: AC
  Administered 2016-03-03: 40 mg via SUBCUTANEOUS
  Filled 2016-03-03: qty 0.4

## 2016-03-03 MED ORDER — ONDANSETRON HCL 4 MG/2ML IJ SOLN
INTRAMUSCULAR | Status: DC | PRN
  Administered 2016-03-03: 4 mg via INTRAVENOUS

## 2016-03-03 MED ORDER — KETOROLAC TROMETHAMINE 30 MG/ML IJ SOLN
INTRAMUSCULAR | Status: AC
  Filled 2016-03-03: qty 1

## 2016-03-03 MED ORDER — SUGAMMADEX SODIUM 200 MG/2ML IV SOLN
INTRAVENOUS | Status: AC
  Filled 2016-03-03: qty 2

## 2016-03-03 MED ORDER — ALVIMOPAN 12 MG PO CAPS
12.0000 mg | ORAL_CAPSULE | Freq: Once | ORAL | Status: AC
  Administered 2016-03-03: 12 mg via ORAL
  Filled 2016-03-03: qty 1

## 2016-03-03 MED ORDER — MIDAZOLAM HCL 2 MG/2ML IJ SOLN: 0.5000 mg | Freq: Once | INTRAMUSCULAR | Status: DC | PRN

## 2016-03-03 MED ORDER — DEXAMETHASONE SODIUM PHOSPHATE 10 MG/ML IJ SOLN
INTRAMUSCULAR | Status: AC
  Filled 2016-03-03: qty 1

## 2016-03-03 MED ORDER — FENTANYL CITRATE (PF) 250 MCG/5ML IJ SOLN
INTRAMUSCULAR | Status: DC | PRN
  Administered 2016-03-03 (×2): 100 ug via INTRAVENOUS
  Administered 2016-03-03 (×3): 50 ug via INTRAVENOUS

## 2016-03-03 MED ORDER — LIDOCAINE 2% (20 MG/ML) 5 ML SYRINGE
INTRAMUSCULAR | Status: AC
  Filled 2016-03-03: qty 5

## 2016-03-03 MED ORDER — PROMETHAZINE HCL 25 MG/ML IJ SOLN
6.2500 mg | INTRAMUSCULAR | Status: DC | PRN
  Administered 2016-03-03: 6.25 mg via INTRAVENOUS

## 2016-03-03 MED ORDER — BUPIVACAINE HCL (PF) 0.25 % IJ SOLN
INTRAMUSCULAR | Status: AC
  Filled 2016-03-03: qty 30

## 2016-03-03 MED ORDER — ACETAMINOPHEN 500 MG PO TABS: 1000.0000 mg | ORAL_TABLET | Freq: Four times a day (QID) | ORAL | Status: DC

## 2016-03-03 MED ORDER — ACETAMINOPHEN 500 MG PO TABS: 1000.0000 mg | ORAL_TABLET | Freq: Four times a day (QID) | ORAL | 0 refills | Status: DC

## 2016-03-03 MED ORDER — KETOROLAC TROMETHAMINE 15 MG/ML IJ SOLN
15.0000 mg | Freq: Four times a day (QID) | INTRAMUSCULAR | Status: DC
  Administered 2016-03-03: 15 mg via INTRAVENOUS

## 2016-03-03 MED ORDER — DEXAMETHASONE SODIUM PHOSPHATE 10 MG/ML IJ SOLN
INTRAMUSCULAR | Status: DC | PRN
  Administered 2016-03-03: 10 mg via INTRAVENOUS

## 2016-03-03 MED ORDER — SODIUM CHLORIDE 0.9 % IJ SOLN
INTRAMUSCULAR | Status: AC
  Filled 2016-03-03: qty 10

## 2016-03-03 MED ORDER — LACTATED RINGERS IR SOLN
Status: DC | PRN
  Administered 2016-03-03: 1000 mL

## 2016-03-03 MED ORDER — SUGAMMADEX SODIUM 200 MG/2ML IV SOLN
INTRAVENOUS | Status: DC | PRN
Start: 2016-03-03 — End: 2016-03-03
  Administered 2016-03-03: 200 mg via INTRAVENOUS

## 2016-03-03 MED ORDER — FENTANYL CITRATE (PF) 250 MCG/5ML IJ SOLN
INTRAMUSCULAR | Status: AC
  Filled 2016-03-03: qty 5

## 2016-03-03 MED ORDER — PROMETHAZINE HCL 25 MG/ML IJ SOLN
INTRAMUSCULAR | Status: AC
  Filled 2016-03-03: qty 1

## 2016-03-03 MED ORDER — ROCURONIUM BROMIDE 50 MG/5ML IV SOSY
PREFILLED_SYRINGE | INTRAVENOUS | Status: DC | PRN
  Administered 2016-03-03: 10 mg via INTRAVENOUS
  Administered 2016-03-03: 40 mg via INTRAVENOUS
  Administered 2016-03-03 (×2): 10 mg via INTRAVENOUS

## 2016-03-03 SURGICAL SUPPLY — 53 items
BAG LAPAROSCOPIC 12 15 PORT 16 (BASKET) IMPLANT
BAG RETRIEVAL 12/15 (BASKET)
CHLORAPREP W/TINT 26ML (MISCELLANEOUS) ×4 IMPLANT
COVER SURGICAL LIGHT HANDLE (MISCELLANEOUS) ×4 IMPLANT
COVER TIP SHEARS 8 DVNC (MISCELLANEOUS) ×3 IMPLANT
COVER TIP SHEARS 8MM DA VINCI (MISCELLANEOUS) ×1
DERMABOND ADVANCED (GAUZE/BANDAGES/DRESSINGS) ×1
DERMABOND ADVANCED .7 DNX12 (GAUZE/BANDAGES/DRESSINGS) ×3 IMPLANT
DRAPE ARM DVNC X/XI (DISPOSABLE) ×12 IMPLANT
DRAPE COLUMN DVNC XI (DISPOSABLE) ×3 IMPLANT
DRAPE DA VINCI XI ARM (DISPOSABLE) ×4
DRAPE DA VINCI XI COLUMN (DISPOSABLE) ×1
DRAPE SHEET LG 3/4 BI-LAMINATE (DRAPES) ×8 IMPLANT
DRAPE SURG IRRIG POUCH 19X23 (DRAPES) ×4 IMPLANT
ELECT REM PT RETURN 9FT ADLT (ELECTROSURGICAL) ×4
ELECTRODE REM PT RTRN 9FT ADLT (ELECTROSURGICAL) ×3 IMPLANT
GLOVE BIO SURGEON STRL SZ 6 (GLOVE) ×16 IMPLANT
GLOVE BIO SURGEON STRL SZ 6.5 (GLOVE) ×8 IMPLANT
GOWN STRL REUS W/ TWL LRG LVL3 (GOWN DISPOSABLE) ×9 IMPLANT
GOWN STRL REUS W/TWL LRG LVL3 (GOWN DISPOSABLE) ×3
HOLDER FOLEY CATH W/STRAP (MISCELLANEOUS) ×4 IMPLANT
IRRIG SUCT STRYKERFLOW 2 WTIP (MISCELLANEOUS) ×4
IRRIGATION SUCT STRKRFLW 2 WTP (MISCELLANEOUS) ×3 IMPLANT
KIT BASIN OR (CUSTOM PROCEDURE TRAY) ×4 IMPLANT
MANIPULATOR UTERINE 4.5 ZUMI (MISCELLANEOUS) IMPLANT
MARKER SKIN DUAL TIP RULER LAB (MISCELLANEOUS) ×4 IMPLANT
OBTURATOR OPTICAL STANDARD 8MM (TROCAR) ×1
OBTURATOR OPTICAL STND 8 DVNC (TROCAR) ×3
OBTURATOR OPTICALSTD 8 DVNC (TROCAR) ×3 IMPLANT
OCCLUDER COLPOPNEUMO (BALLOONS) IMPLANT
PAD POSITIONING PINK XL (MISCELLANEOUS) ×4 IMPLANT
PORT ACCESS TROCAR AIRSEAL 12 (TROCAR) ×3 IMPLANT
PORT ACCESS TROCAR AIRSEAL 5M (TROCAR) ×1
POUCH SPECIMEN RETRIEVAL 10MM (ENDOMECHANICALS) ×4 IMPLANT
SEAL CANN UNIV 5-8 DVNC XI (MISCELLANEOUS) ×12 IMPLANT
SEAL XI 5MM-8MM UNIVERSAL (MISCELLANEOUS) ×4
SET TRI-LUMEN FLTR TB AIRSEAL (TUBING) ×4 IMPLANT
SHEET LAVH (DRAPES) ×4 IMPLANT
SOLUTION ELECTROLUBE (MISCELLANEOUS) ×4 IMPLANT
SUT MNCRL AB 4-0 PS2 18 (SUTURE) ×8 IMPLANT
SUT VIC AB 0 CT1 27 (SUTURE)
SUT VIC AB 0 CT1 27XBRD ANTBC (SUTURE) IMPLANT
SUT VIC AB 3-0 SH 27 (SUTURE) ×1
SUT VIC AB 3-0 SH 27XBRD (SUTURE) ×3 IMPLANT
SYR 50ML LL SCALE MARK (SYRINGE) IMPLANT
TOWEL OR 17X26 10 PK STRL BLUE (TOWEL DISPOSABLE) ×8 IMPLANT
TOWEL OR NON WOVEN STRL DISP B (DISPOSABLE) ×4 IMPLANT
TRAP SPECIMEN MUCOUS 40CC (MISCELLANEOUS) ×4 IMPLANT
TRAY FOLEY W/METER SILVER 16FR (SET/KITS/TRAYS/PACK) ×4 IMPLANT
TRAY LAPAROSCOPIC (CUSTOM PROCEDURE TRAY) ×4 IMPLANT
TROCAR BLADELESS OPT 5 100 (ENDOMECHANICALS) ×4 IMPLANT
UNDERPAD 30X30 INCONTINENT (UNDERPADS AND DIAPERS) ×4 IMPLANT
WATER STERILE IRR 1500ML POUR (IV SOLUTION) ×4 IMPLANT

## 2016-03-03 NOTE — Discharge Instructions (Signed)
SHOWER ONLY-DO NOT RUB INCISION SITES; PAT DRY AFTER SHOWERING.     General Anesthesia, Adult, Care After These instructions provide you with information about caring for yourself after your procedure. Your health care provider may also give you more specific instructions. Your treatment has been planned according to current medical practices, but problems sometimes occur. Call your health care provider if you have any problems or questions after your procedure. What can I expect after the procedure? After the procedure, it is common to have:  Vomiting.  A sore throat.  Mental slowness. It is common to feel:  Nauseous.  Cold or shivery.  Sleepy.  Tired.  Sore or achy, even in parts of your body where you did not have surgery. Follow these instructions at home: For at least 24 hours after the procedure:  Do not:  Participate in activities where you could fall or become injured.  Drive.  Use heavy machinery.  Drink alcohol.  Take sleeping pills or medicines that cause drowsiness.  Make important decisions or sign legal documents.  Take care of children on your own.  Rest. Eating and drinking  If you vomit, drink water, juice, or soup when you can drink without vomiting.  Drink enough fluid to keep your urine clear or pale yellow.  Make sure you have little or no nausea before eating solid foods.  Follow the diet recommended by your health care provider. General instructions  Have a responsible adult stay with you until you are awake and alert.  Return to your normal activities as told by your health care provider. Ask your health care provider what activities are safe for you.  Take over-the-counter and prescription medicines only as told by your health care provider.  If you smoke, do not smoke without supervision.  Keep all follow-up visits as told by your health care provider. This is important. Contact a health care provider if:  You continue to  have nausea or vomiting at home, and medicines are not helpful.  You cannot drink fluids or start eating again.  You cannot urinate after 8-12 hours.  You develop a skin rash.  You have fever.  You have increasing redness at the site of your procedure. Get help right away if:  You have difficulty breathing.  You have chest pain.  You have unexpected bleeding.  You feel that you are having a life-threatening or urgent problem. This information is not intended to replace advice given to you by your health care provider. Make sure you discuss any questions you have with your health care provider. Document Released: 04/27/2000 Document Revised: 06/24/2015 Document Reviewed: 01/03/2015 Elsevier Interactive Patient Education  2017 [employer].

## 2016-03-03 NOTE — Transfer of Care (Signed)
Immediate Anesthesia Transfer of Care Note  Patient: Sara Pratt  Procedure(s) Performed: Procedure(s): XI ROBOTIC ASSISTED LEFT  SALPINGO OOPHORECTOMY (Left) LYSIS OF ADHESION (Left)  Patient Location: PACU  Anesthesia Type:General  Level of Consciousness: awake and patient cooperative  Airway & Oxygen Therapy: Patient Spontanous Breathing and Patient connected to face mask oxygen  Post-op Assessment: Report given to RN and Post -op Vital signs reviewed and stable  Post vital signs: Reviewed and stable  Last Vitals:  Vitals:   03/03/16 0819  BP: 133/88  Pulse: 65  Resp: 16  Temp: 36.5 C    Last Pain:  Vitals:   03/03/16 0819  TempSrc: Oral         Complications: No apparent anesthesia complications

## 2016-03-03 NOTE — Anesthesia Preprocedure Evaluation (Addendum)
Anesthesia Evaluation  Patient identified by MRN, date of birth, ID band Patient awake    Reviewed: Allergy & Precautions, NPO status , Patient's Chart, lab work & pertinent test results  History of Anesthesia Complications (+) PONV  Airway Mallampati: I  TM Distance: >3 FB Neck ROM: Full    Dental  (+) Dental Advisory Given, Missing   Pulmonary asthma (has not needed inhaler recently) , Current Smoker,    breath sounds clear to auscultation       Cardiovascular (-) anginanegative cardio ROS   Rhythm:Regular Rate:Normal     Neuro/Psych negative neurological ROS     GI/Hepatic Neg liver ROS, GERD  Controlled,  Endo/Other  negative endocrine ROS  Renal/GU negative Renal ROS     Musculoskeletal   Abdominal   Peds  Hematology negative hematology ROS (+)   Anesthesia Other Findings   Reproductive/Obstetrics                            Anesthesia Physical Anesthesia Plan  ASA: II  Anesthesia Plan: General   Post-op Pain Management:    Induction: Intravenous  Airway Management Planned: Oral ETT  Additional Equipment:   Intra-op Plan:   Post-operative Plan: Extubation in OR  Informed Consent: I have reviewed the patients History and Physical, chart, labs and discussed the procedure including the risks, benefits and alternatives for the proposed anesthesia with the patient or authorized representative who has indicated his/her understanding and acceptance.   Dental advisory given  Plan Discussed with: CRNA and Surgeon  Anesthesia Plan Comments: (Plan routine monitors, GETA)        Anesthesia Quick Evaluation

## 2016-03-03 NOTE — Anesthesia Postprocedure Evaluation (Addendum)
Anesthesia Post Note  Patient: Sara Pratt  Procedure(s) Performed: Procedure(s) (LRB): XI ROBOTIC ASSISTED LEFT  SALPINGO OOPHORECTOMY (Left) LYSIS OF ADHESION (Left)  Patient location during evaluation: PACU Anesthesia Type: General Level of consciousness: awake and alert Pain management: pain level controlled Vital Signs Assessment: post-procedure vital signs reviewed and stable Respiratory status: spontaneous breathing, nonlabored ventilation, respiratory function stable and patient connected to nasal cannula oxygen Cardiovascular status: blood pressure returned to baseline and stable Postop Assessment: no signs of nausea or vomiting Anesthetic complications: no       Last Vitals:  Vitals:   03/03/16 1330 03/03/16 1345  BP: (!) 146/87 (!) 156/97  Pulse: 94 87  Resp: 14 12  Temp:      Last Pain:  Vitals:   03/03/16 1345  TempSrc:   PainSc: Asleep                 [name]

## 2016-03-03 NOTE — H&P (Addendum)
Consult was requested by Dr. Elly Modena for the evaluation of Parker Unknown Jim 41 y.o. female  CC:     Chief Complaint  Patient presents with  . left ovarian mass    Assessment/Plan:  Ms. PAYTIENCE STRITTMATTER  is a 41 y.o.  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  I discussed with Sharla that I have a low suspicion that this mass is malignant given its features on imaging and her normal tumor marker profile. However, it is very symptomatic and therefore I recommend its removal.  She has substantial adhesions between bowel and the ovary. She may require bowel resection during the procedure in order to facilitate ovarian removal.  I am recommending robotic assisted left salpingo-oophorectomy, lysis of adhesions, possible laparotomy.  I discussed that the surgery is associated with risks including  bleeding, infection, damage to internal organs (such as bladder,ureters, bowels), blood clot, reoperation and rehospitalization. I discussed that the risk of GI injury is particularly high for her. I discussed that if small or large bowel resection and reanastamosis is required, she will be at increased risk for leak, stricture or formation of diverting stoma.  She is also at increased risk for infection if GI surgery is performed.  She restated a desire to receive no blood products/blood transfusion even in life or death circumstances (eg lifethreatening hemorrhage). She stated that she would rather die than receive a transfusion. This discussion was witnessed by OR personnel.   HPI: Tayliana Winner is a 41 year old woman who is seen in consultation at the request of Dr Elly Modena for a left ovarian mass.  The patient reports having abdominal pain for approximately 3 months. An MRI of the pelvis was performed on 10/30/15 as her pain was initially in her back and her provider though this might be a spine issue. The scan showed a 7.3cm cystic mass with an  internal septation and small mural nodule arising from the left ovary. She was seen and evaluated with CT abdo/pelvis on 12/10/15 which confirmed the cystic mass measuring 9x7.1x8.6cm. No ascites/carcinomatosis/nodularity/adenopathy was identified.  CA 125 on 11/04/15 was normal at 7.  She was taken to the OR by Dr Elly Modena on 11/12/15 for a planned LSO, however, intraoperative adhesions between the bowel and ovary were so substantial that the ovary could not be visualized and the surgery was aborted.  The patient has a history of multiple medical procedures including 5 cesarean sections. Her last pregnancy was complicated by uterine rupture (and fetal demise) in the "9th month" and she required an emergent hysterectomy. She has also had an umbilical hernia repair, appendectomy and cholecystectomy. She has had a prior abdominoplasty.  She is an active smoker (2 packs per day).   Current Meds:      Outpatient Encounter Prescriptions as of 01/01/2016  Medication Sig  . docusate sodium (COLACE) 100 MG capsule Take 1 capsule (100 mg total) by mouth 2 (two) times daily as needed.  . hydrOXYzine (ATARAX/VISTARIL) 25 MG tablet Take 25 mg by mouth 3 (three) times daily as needed for anxiety.  . naproxen (NAPROSYN) 500 MG tablet Take 500 mg by mouth 2 (two) times daily as needed for mild pain.  Marland Kitchen ondansetron (ZOFRAN ODT) 4 MG disintegrating tablet Take 1 tablet (4 mg total) by mouth every 8 (eight) hours as needed for nausea or vomiting.  Marland Kitchen oxyCODONE-acetaminophen (PERCOCET/ROXICET) 5-325 MG tablet Take 1-2 tablets by mouth every 6 (six) hours as needed for  severe pain.  . promethazine (PHENERGAN) 25 MG tablet Take 1 tablet (25 mg total) by mouth every 6 (six) hours as needed for nausea or vomiting.  . [DISCONTINUED] predniSONE (DELTASONE) 5 MG tablet Take 5 mg by mouth daily with breakfast.  . oxyCODONE (ROXICODONE) 5 MG immediate release tablet Take 1 tablet (5 mg total) by mouth every 4 (four) hours  as needed for severe pain.  Marland Kitchen senna (SENOKOT) 8.6 MG TABS tablet Take 1 tablet (8.6 mg total) by mouth 30 (thirty) minutes before procedure.   No facility-administered encounter medications on file as of 01/01/2016.     Allergy: No Known Allergies  Social Hx:   Social History        Social History  . Marital status: Divorced    Spouse name: N/A  . Number of children: N/A  . Years of education: N/A      Occupational History  . Not on file.         Social History Main Topics  . Smoking status: Current Some Day Smoker    Packs/day: 2.00    Years: 8.00    Types: Cigarettes  . Smokeless tobacco: Never Used  . Alcohol use Yes     Comment: Wine/ , liquor  "depends on the day"  . Drug use: No  . Sexual activity: Yes    Birth control/ protection: None, Surgical       Other Topics Concern  . Not on file      Social History Narrative  . No narrative on file    Past Surgical Hx:       Past Surgical History:  Procedure Laterality Date  . ABDOMINAL HYSTERECTOMY    . ABDOMINOPLASTY    . APPENDECTOMY    . CESAREAN SECTION    . CHOLECYSTECTOMY    . HERNIA REPAIR    . LAPAROSCOPY  11/12/2015   Procedure: LAPAROSCOPY DIAGNOSTIC;  Surgeon: Mora Bellman, MD;  Location: [facility];  Service: Gynecology;;  . LYSIS OF ADHESION  11/12/2015   Procedure: LYSIS OF ADHESION;  Surgeon: Mora Bellman, MD;  Location: [facility];  Service: Gynecology;;    Past Medical Hx:      Past Medical History:  Diagnosis Date  . Anxiety   . Arthritis   . Asthma   . GERD (gastroesophageal reflux disease)   . Pneumonia   . PONV (postoperative nausea and vomiting)   . Scoliosis     Past Gynecological History:  5 cesarean sections. Emergent hyst for ruptured uterus. No LMP recorded. Patient has had a hysterectomy.  Family Hx: History reviewed. No pertinent family history.  Review of Systems:  Constitutional  Feels well,    ENT Normal  appearing ears and nares bilaterally Skin/Breast  No rash, sores, jaundice, itching, dryness Cardiovascular  No chest pain, shortness of breath, or edema  Pulmonary  No cough or wheeze.  Gastro Intestinal  No nausea, vomitting, or diarrhoea. No bright red blood per rectum, +abdominal pain, no change in bowel movement, or constipation.  Genito Urinary  No frequency, urgency, dysuria, no bleeding Musculo Skeletal  No myalgia, arthralgia, joint swelling or pain  Neurologic  No weakness, numbness, change in gait,  Psychology  No depression, anxiety, insomnia.   Vitals:  Blood pressure (!) 145/85, pulse 79, temperature 98.8 F (37.1 C), temperature source Oral, resp. rate 19.  Physical Exam: WD in NAD Neck  Supple NROM, without any enlargements.  Lymph Node Survey No cervical supraclavicular or inguinal adenopathy Cardiovascular  Pulse normal rate, regularity and rhythm. S1 and S2 normal.  Lungs  Clear to auscultation bilateraly, without wheezes/crackles/rhonchi. Good air movement.  Skin  No rash/lesions/breakdown  Psychiatry  Alert and oriented to person, place, and time  Abdomen  Normoactive bowel sounds, abdomen soft, non-tender and thin without evidence of hernia. Tenderness in left lower quadrant to palpation Back No CVA tenderness Genito Urinary  Vulva/vagina: Normal external female genitalia.  No lesions. No discharge or bleeding.             Bladder/urethra:  No lesions or masses, well supported bladder             Vagina: normal             Cervix: palpable residual fragment of cervix.             Uterus: surgically absent              Adnexa: Cystic, smooth mass appreciated in the pelvis.   Rectal  deferred Extremities  No bilateral cyanosis, clubbing or edema  Donaciano Eva, MD

## 2016-03-03 NOTE — Anesthesia Procedure Notes (Signed)
Procedure Name: Intubation Date/Time: 03/03/2016 10:41 AM Performed by: Dione Booze Pre-anesthesia Checklist: Emergency Drugs available, Suction available, Patient being monitored and Patient identified Patient Re-evaluated:Patient Re-evaluated prior to inductionOxygen Delivery Method: Circle system utilized Preoxygenation: Pre-oxygenation with 100% oxygen Intubation Type: IV induction Laryngoscope Size: Mac and 4 Grade View: Grade I Tube type: Oral Tube size: 7.5 mm Number of attempts: 1 Airway Equipment and Method: Stylet Placement Confirmation: ETT inserted through vocal cords under direct vision,  positive ETCO2 and breath sounds checked- equal and bilateral Secured at: 22 cm Tube secured with: Tape Dental Injury: Teeth and Oropharynx as per pre-operative assessment

## 2016-03-03 NOTE — Op Note (Signed)
OPERATIVE NOTE  Date: 03/03/16  Preoperative Diagnosis: left ovarian cyst   Postoperative Diagnosis:  Same and complex adhesive disease  Procedure(s) Performed: Robotic-assisted laparoscopic left salpingo-oophorectomy, lysis of adhesions.  Surgeon: Everitt Amber, M.D.  Assistant Surgeon: Lahoma Crocker M.D. (an MD assistant was necessary for tissue manipulation, management of robotic instrumentation, retraction and positioning due to the complexity of the case and hospital policies).   Anesthesia: Gen. endotracheal.  Specimens: left fallopian tube and ovary, pelvic washings  Estimated Blood Loss: 100 mL. Blood Replacement: None  Complications: none  Indication for Procedure:  Symptomatic left ovarian cyst (8cm), normal CA 125.  Operative Findings: 8cm left ovarian cystic mass retroperitoneal behind sigmoid colon and densely adherent to sigmoid colon. Right tube normal, right ovary with 2-3cm simple cyst. Surgically absent uterus.  Frozen pathology was consistent with benign cyst.  Procedure: The patient's taken to the operating room and placed under general endotracheal anesthesia testing difficulty. She is placed in a dorsolithotomy position and cervical acromial pad was placed. The arms were tucked with care taken to pad the olecranon process. And prepped and draped in usual sterile fashion. A uterine manipulator (zumi) was placed vaginally. A 37mm incision was made in the left upper quadrant palmer's point and a 5 mm Optiview trocar used to enter the abdomen under direct visualization. With entry into the abdomen and then maintenance of 15 mm of mercury the patient was placed in Trendelenburg position. An incision was made 2cm above the umbilicus and a 2mm trochar was placed through this site. Two incisions were made lateral to the umbilical incision in the left and right abdomen measuring 4mm. These incisions were made approximately 10 cm lateral to the umbilical incision. 8 mm robotic  trochars were inserted. No upper abdominal adhesions were noted. The robot was docked.  The abdomen was inspected as was the pelvis.  Pelvic washings were obtained.  For approximately 2 hours sharp and meticulous monopolar adhesiolysis was performed to separate the sigmoid colon from the left ovarian mass. Care was used to use sharp dissection where the colonic wall was adherent to cystic mass. During the dissection the robot lost power and shut down. No tissue was being grasped at the time of shut down. After the robot was rebooted the surgical field was revisualized and no internal injury or bleeding was noted. The robot remained functional throughout the remainder of the case.  The cyst was intentionally ruptured to allow for manipulation of the cyst wall relative to the sigmoid colon mesentery and bowel wall. An incision was made on the left pelvic side wall peritoneum parallel to the IP ligament and the retroperitoneal space entered. The left ureter was identified and the para-rectal space was developed using careful dissection because the ureter was densely adherent to the retroperitoneal cyst. A window was created in the right broad ligament above the ureter. The left infundibulopelvic vessels were skeletonized cauterized and transected. The attachments between the cyst and the cervical stump were similarly were cauterized and transected. Specimen was placed in an Endo Catch bag. It was removed, sent for pathology which returned as benign.  The sigmoid colon was carefully inspected. Two areas were the serosal had been resected with the mass (each measuring less than 1cm) were imbricated with 3-0 vicryl lemberts sutures. The mesentery was inspected and was hemostatic. The left ureter was in tact.   The abdomen was copiously irrigated and drained and all operative sites inspected and hemostasis was assured. The robot was undocked.  The  ports were all removed. The fascial closure at the umbilical  incision and left upper quadrant port was made with 0 Vicryl.  All incisions were closed with a running subcuticular Monocryl suture. Dermabond was applied. Marcaine was injected into the incisions. Sponge, lap and needle counts were correct x 3.    The patient had sequential compression devices for VTE prophylaxis.         Disposition: PACU -stable         Condition: stable  Donaciano Eva, MD

## 2016-03-04 ENCOUNTER — Encounter (HOSPITAL_COMMUNITY): Payer: Self-pay | Admitting: Gynecologic Oncology

## 2016-03-25 ENCOUNTER — Ambulatory Visit: Payer: Medicaid Other | Attending: Gynecologic Oncology | Admitting: Gynecologic Oncology

## 2016-03-25 ENCOUNTER — Other Ambulatory Visit: Payer: Self-pay

## 2016-03-25 ENCOUNTER — Encounter: Payer: Self-pay | Admitting: Gynecologic Oncology

## 2016-03-25 VITALS — BP 133/88 | HR 73 | Temp 98.8°F | Resp 18 | Wt 152.9 lb

## 2016-03-25 DIAGNOSIS — N393 Stress incontinence (female) (male): Secondary | ICD-10-CM

## 2016-03-25 DIAGNOSIS — D271 Benign neoplasm of left ovary: Secondary | ICD-10-CM | POA: Insufficient documentation

## 2016-03-25 DIAGNOSIS — R32 Unspecified urinary incontinence: Secondary | ICD-10-CM | POA: Insufficient documentation

## 2016-03-25 DIAGNOSIS — Z9889 Other specified postprocedural states: Secondary | ICD-10-CM | POA: Insufficient documentation

## 2016-03-25 DIAGNOSIS — N83202 Unspecified ovarian cyst, left side: Secondary | ICD-10-CM

## 2016-03-25 DIAGNOSIS — Z87442 Personal history of urinary calculi: Secondary | ICD-10-CM

## 2016-03-25 HISTORY — DX: Unspecified urinary incontinence: R32

## 2016-03-25 NOTE — Progress Notes (Signed)
POSTOPERATIVE EVALUATION  HPI:  Sara Pratt is a 41 y.o. year old G6P0 initially seen in consultation on 01/01/16 for a left ovarian cyst.  She then underwent a robotic assisted LSO and lysis of adhesions on [id number] without complications.  Her postoperative course was uncomplicated.  Her final pathology revealed benign serous cystadenoma.  She is seen today for a postoperative check and to discuss her pathology results and ongoing plan.  Since discharge from the hospital, she is feeling generally well but is having urinary incontinence (stress) and occasional flank pain (and she notes that she had a renal stone identified on CT imaging preop).  She has improving appetite,normal GI function, and pain controlled with minimal PO medication. She has no other complaints today.    Review of systems: Constitutional:  She has no weight gain or weight loss. She has no fever or chills. Eyes: No blurred vision Ears, Nose, Mouth, Throat: No dizziness, headaches or changes in hearing. No mouth sores. Cardiovascular: No chest pain, palpitations or edema. Respiratory:  No shortness of breath, wheezing or cough Gastrointestinal: She has normal bowel movements without diarrhea or constipation. She denies any nausea or vomiting. She denies blood in her stool or heart burn. Some discomfort with defecation Genitourinary:  + urinary frequency. Musculoskeletal: Denies muscle weakness or joint pains.  Skin:  She has no skin changes, rashes or itching Neurological:  Denies dizziness or headaches. No neuropathy, no numbness or tingling. Psychiatric:  She denies depression or anxiety. Hematologic/Lymphatic:   No easy bruising or bleeding   Physical Exam: Blood pressure 133/88, pulse 73, temperature 98.8 F (37.1 C), temperature source Oral, resp. rate 18, weight 152 lb 14.4 oz (69.4 kg), SpO2 100 %. General: Well dressed, well nourished in no apparent distress.   Abdomen:  Soft, nontender, nondistended.  No  palpable masses.  No hepatosplenomegaly.  No ascites. Normal bowel sounds.  No hernias.  Incisions are healing normally Genitourinary: deferred  Extremities: No cyanosis, clubbing or edema.  No calf tenderness or erythema. No palpable cords. Psychiatric: Mood and affect are appropriate. Neurological: Awake, alert and oriented x 3. Sensation is intact, no neuropathy.  Musculoskeletal: No pain, normal strength and range of motion.  Assessment:    41 y.o. year old with history of left ovarian cyst.   S/p LSO and lysis of adhesions on /30/18.   Plan: 1) Pathology reports reviewed today 2) Treatment counseling - for her urinary incontinence and concern about history of renal stones I will refer her to Dr Burman Nieves She was given the opportunity to ask questions, which were answered to her satisfaction, and she is agreement with the above mentioned plan of care.  3)  Return to clinic on a prn basis. She will return to see Dr Elly Modena annually for well woman check  Donaciano Eva, MD

## 2016-03-25 NOTE — Patient Instructions (Signed)
Follow-up with Dr Elly Modena for your well woman check once a year. You do not require follow-up with Dr Denman George.  Apply neosporin ointment to left incision until glue falls off.  We will schedule a consultation with Dr Louis Meckel from [facility].  Donaciano Eva, MD

## 2016-03-31 ENCOUNTER — Telehealth: Payer: Self-pay

## 2016-03-31 NOTE — Telephone Encounter (Signed)
Referral information faxed to [facility].

## 2016-07-06 NOTE — Addendum Note (Signed)
Addendum  created 07/06/16 1038 by Ustin Cruickshank, MD   Sign clinical note    

## 2016-09-01 ENCOUNTER — Ambulatory Visit: Payer: Medicaid Other | Admitting: Obstetrics and Gynecology

## 2017-05-30 ENCOUNTER — Emergency Department (HOSPITAL_COMMUNITY): Payer: Medicaid Other

## 2017-05-30 ENCOUNTER — Encounter (HOSPITAL_COMMUNITY): Payer: Self-pay | Admitting: Emergency Medicine

## 2017-05-30 ENCOUNTER — Emergency Department (HOSPITAL_COMMUNITY)
Admission: EM | Admit: 2017-05-30 | Discharge: 2017-05-31 | Disposition: A | Discharge status: Home / Self Care | Payer: Medicaid Other | Attending: Emergency Medicine | Admitting: Emergency Medicine

## 2017-05-30 DIAGNOSIS — R079 Chest pain, unspecified: Secondary | ICD-10-CM | POA: Diagnosis not present

## 2017-05-30 DIAGNOSIS — Z5321 Procedure and treatment not carried out due to patient leaving prior to being seen by health care provider: Secondary | ICD-10-CM | POA: Insufficient documentation

## 2017-05-30 LAB — CBC
HCT: 39.7 % (ref 36.0–46.0)
HEMOGLOBIN: 13.9 g/dL (ref 12.0–15.0)
MCH: 29.1 pg (ref 26.0–34.0)
MCHC: 35 g/dL (ref 30.0–36.0)
MCV: 83.1 fL (ref 78.0–100.0)
PLATELETS: 146 10*3/uL — AB (ref 150–400)
RBC: 4.78 MIL/uL (ref 3.87–5.11)
RDW: 14.4 % (ref 11.5–15.5)
WBC: 5.7 10*3/uL (ref 4.0–10.5)

## 2017-05-30 LAB — BASIC METABOLIC PANEL
ANION GAP: 9 (ref 5–15)
BUN: 16 mg/dL (ref 6–20)
CHLORIDE: 106 mmol/L (ref 101–111)
CO2: 23 mmol/L (ref 22–32)
Calcium: 9 mg/dL (ref 8.9–10.3)
Creatinine, Ser: 0.89 mg/dL (ref 0.44–1.00)
GFR calc Af Amer: >60 mL/min (ref >60)
GFR calc non Af Amer: >60 mL/min (ref >60)
Glucose, Bld: 89 mg/dL (ref 65–99)
POTASSIUM: 3.5 mmol/L (ref 3.5–5.1)
SODIUM: 138 mmol/L (ref 135–145)

## 2017-05-30 LAB — I-STAT BETA HCG BLOOD, ED (MC, WL, AP ONLY): I-stat hCG, quantitative: <5 m[IU]/mL (ref <5)

## 2017-05-30 LAB — I-STAT TROPONIN, ED: Troponin i, poc: 0 ng/mL (ref 0.00–0.08)

## 2017-05-30 MED ORDER — OXYCODONE-ACETAMINOPHEN 5-325 MG PO TABS
1.0000 | ORAL_TABLET | ORAL | Status: DC | PRN
  Administered 2017-05-30: 1 via ORAL
  Filled 2017-05-30: qty 1

## 2017-05-30 NOTE — ED Triage Notes (Signed)
Reports having left sided chest pressure since Wednesday that radiates into the left shoulder.

## 2017-05-31 NOTE — ED Notes (Signed)
Pt states that she is leaving because she has to go to work. Staff encouraged her to stay and to come back if she has any changes.

## 2017-09-06 IMAGING — CT CT ABD-PELV W/ CM
2 of 4 series · 16 of 46 positions shown, 18 images · IV contrast (Omni 300)
Comparison: None.

CLINICAL DATA: Sudden onset left lower quadrant abdominal pain with
left flank pain, nausea, and diarrhea.

EXAM:
CT ABDOMEN AND PELVIS WITH CONTRAST
TECHNIQUE: Multidetector CT imaging of the abdomen and pelvis was performed
using the standard protocol following bolus administration of
intravenous contrast.
CONTRAST:  100 ml [id number] IOPAMIDOL ([id number]) INJECTION 61%

[Series 2: a/p w/ 5mm · axial · 0.78mm/px · z∈[-350,+46]mm · 13 of 89 slices shown, 15 images]
[im 5/89  soft-tissue]
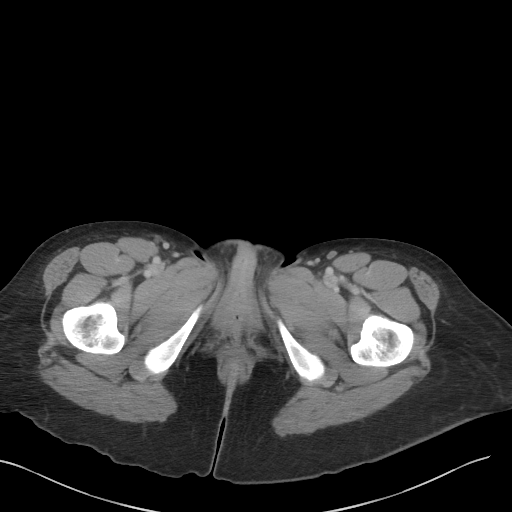
[im 5/89  bone]
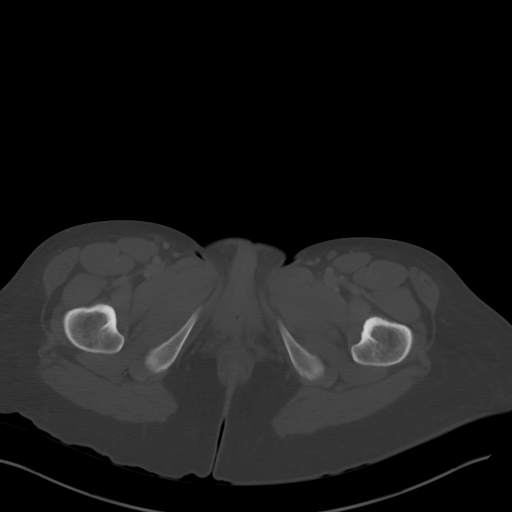
[im 13/89  soft-tissue]
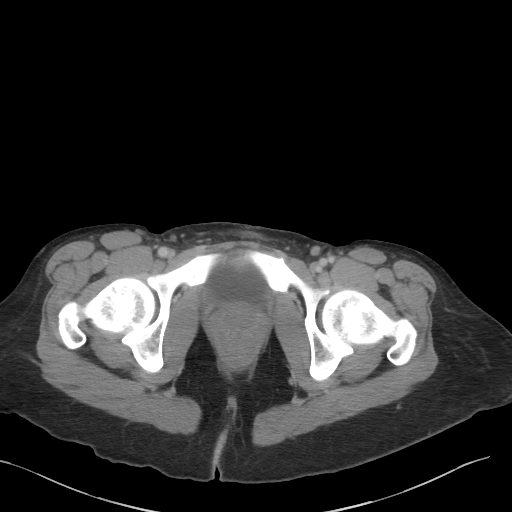
[im 17/89  soft-tissue]
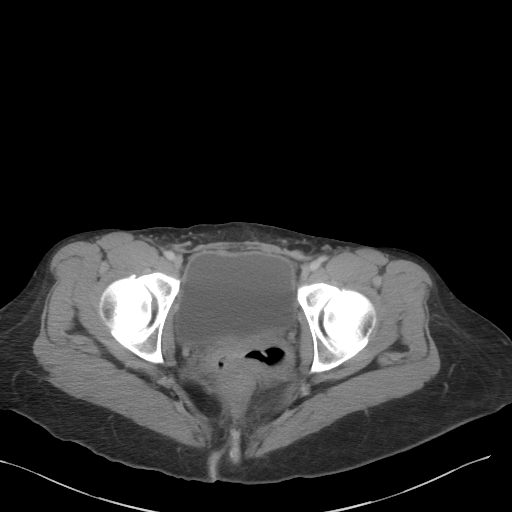
[im 26/89  soft-tissue]
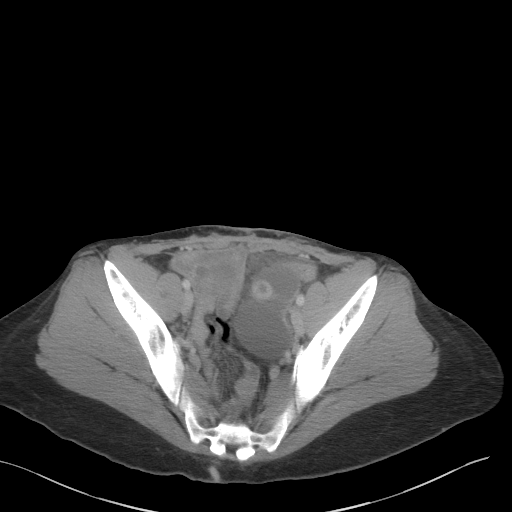
[im 30/89  soft-tissue]
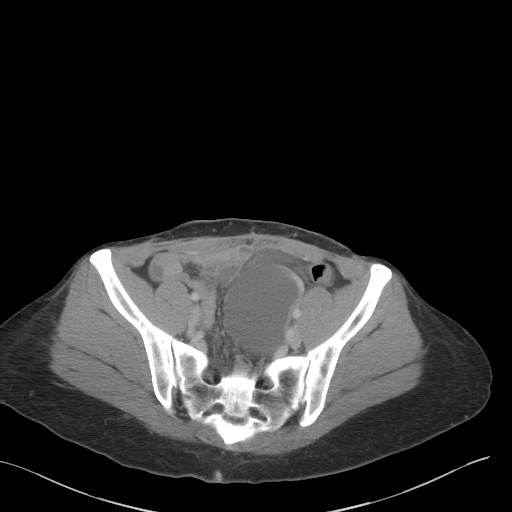
[im 38/89  soft-tissue]
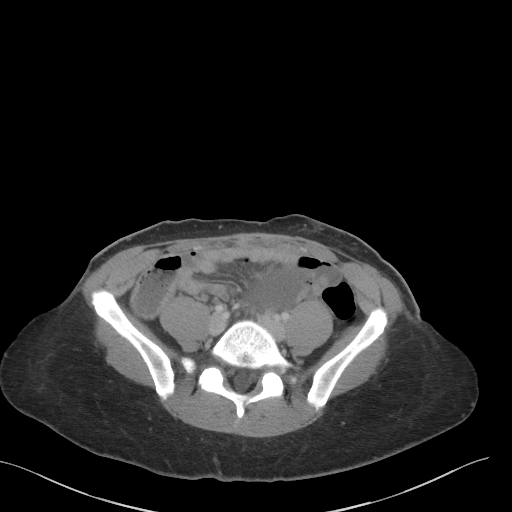
[im 47/89  soft-tissue]
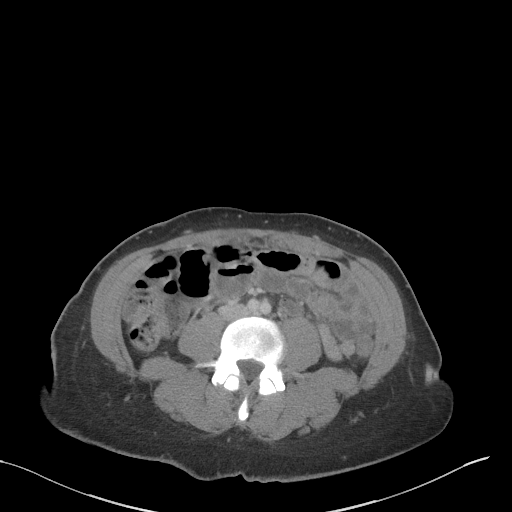
[im 51/89  soft-tissue]
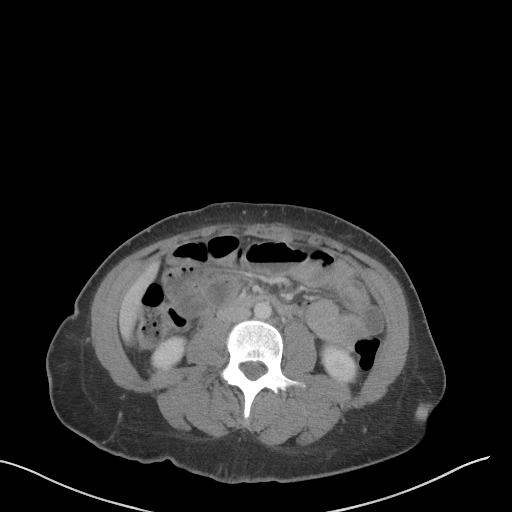
[im 59/89  soft-tissue]
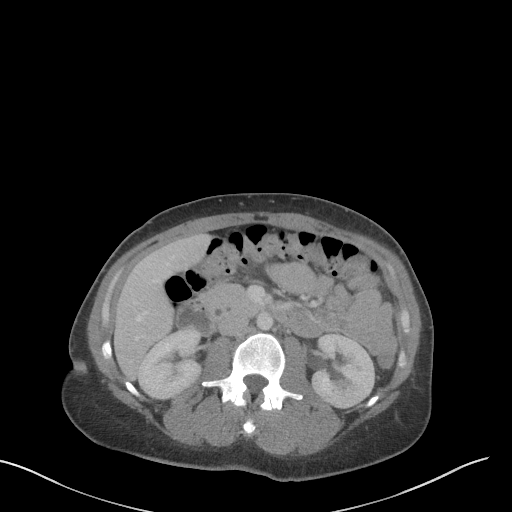
[im 59/89  bone]
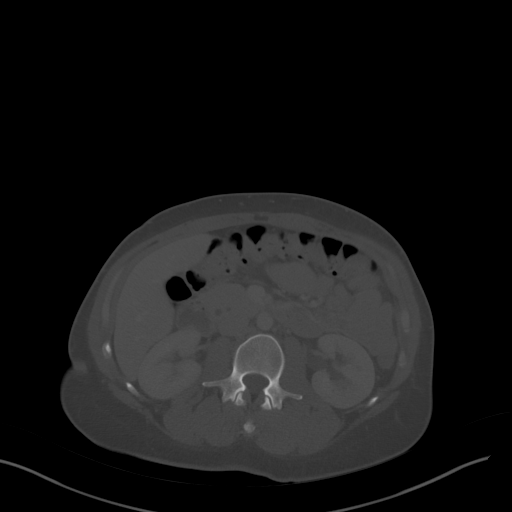
[im 63/89  soft-tissue]
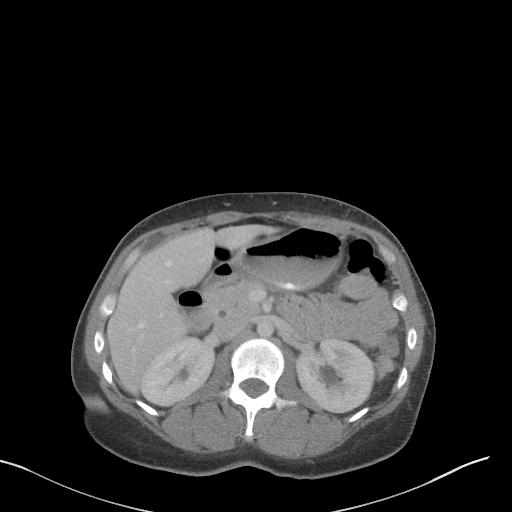
[im 72/89  soft-tissue]
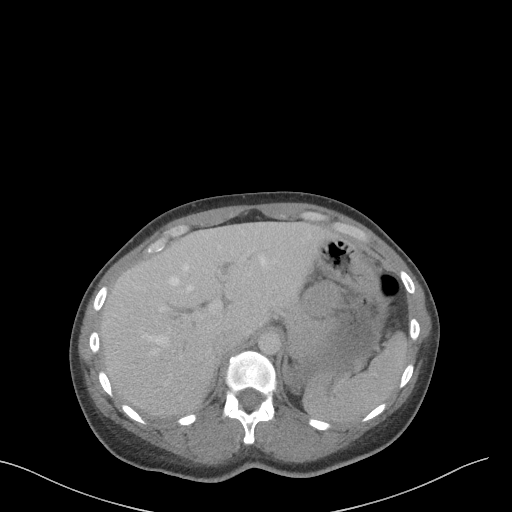
[im 76/89  soft-tissue]
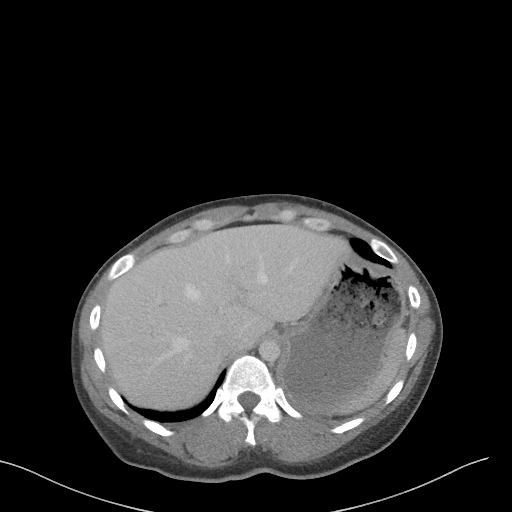
[im 84/89  soft-tissue]
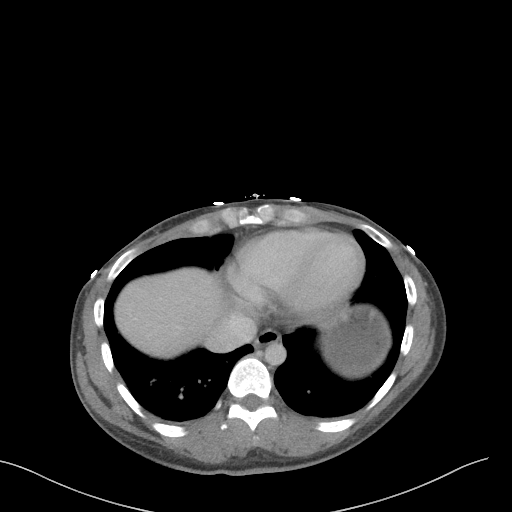

[Series 5: a/p w/ cor · coronal · 0.63mm/px · 3 of 109 slices shown]
[im 37/109  soft-tissue]
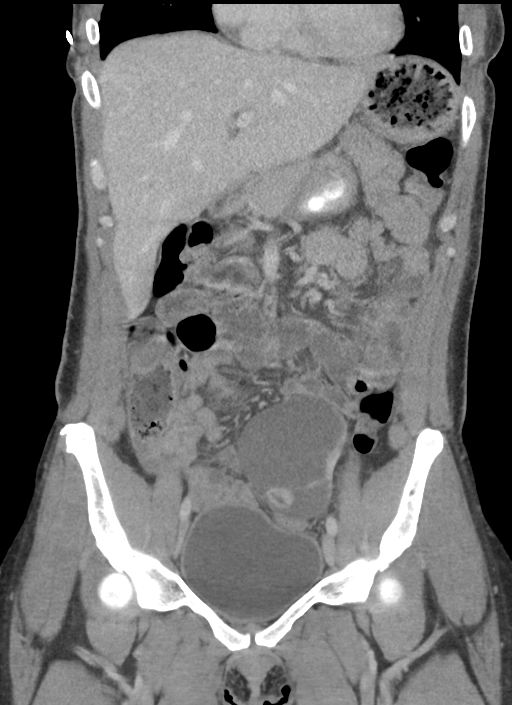
[im 49/109  soft-tissue]
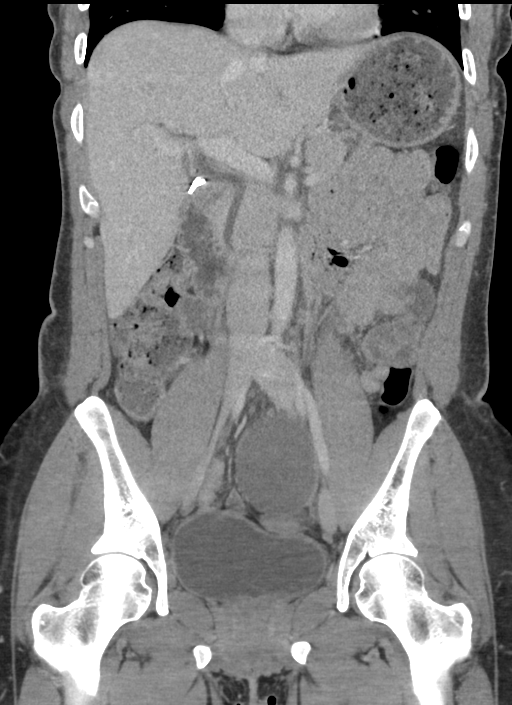
[im 61/109  soft-tissue]
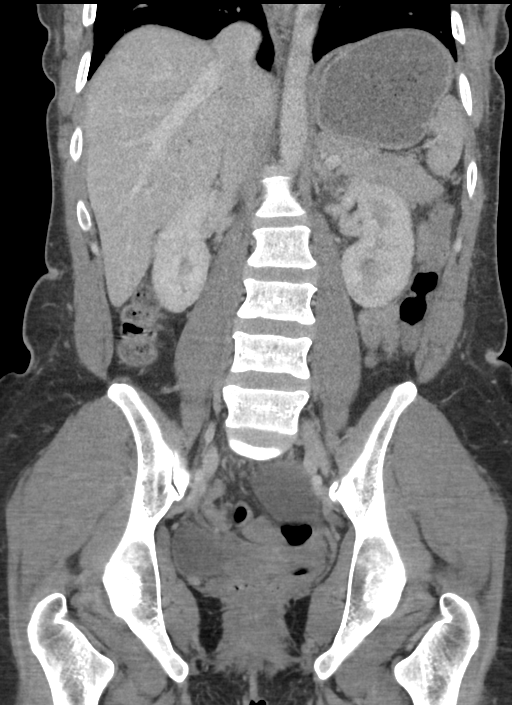

[16 of 46 positions shown; findings below may reference images not displayed]

FINDINGS: Atelectasis in the lung bases.

Surgical absence of the gallbladder. No bile duct dilatation. The
liver, spleen, pancreas, adrenal glands, kidneys, abdominal aorta,
inferior vena cava, and retroperitoneal lymph nodes are
unremarkable. Stomach, small bowel, and colon are not abnormally
distended. No free air or free fluid in the abdomen.

Pelvis: Uterus is surgically absent. Complex cystic structure in the
left adnexum measuring 5.2 x 7.4 x 7.8 cm. There is thick septation
and focal nodularity with central lucency. Small amount of fluid or
stranding around cystic structure. Small amount of free fluid in the
pelvis. This could represent hemorrhagic cyst, infected cyst, or
endometriosis. Cystic neoplasm is not excluded. Suggest ultrasound
for further characterization. Bladder wall is not thickened. No
pelvic lymphadenopathy. No destructive bone lesions.
IMPRESSION: 7.8 cm max diameter complex indeterminate cystic structure in the
left adnexum. Ultrasound suggested for further characterization.
Small amount of free fluid in the pelvis is likely reactive.

## 2017-09-15 ENCOUNTER — Other Ambulatory Visit: Payer: Self-pay | Admitting: Orthopaedic Surgery

## 2017-09-15 DIAGNOSIS — M47896 Other spondylosis, lumbar region: Secondary | ICD-10-CM

## 2017-09-18 ENCOUNTER — Ambulatory Visit
Admission: RE | Admit: 2017-09-18 | Discharge: 2017-09-18 | Disposition: A | Discharge status: Home / Self Care | Payer: Medicaid Other | Source: Ambulatory Visit | Attending: Orthopaedic Surgery | Admitting: Orthopaedic Surgery

## 2017-09-18 DIAGNOSIS — M47896 Other spondylosis, lumbar region: Secondary | ICD-10-CM

## 2017-10-20 ENCOUNTER — Encounter: Payer: Self-pay | Admitting: Obstetrics & Gynecology

## 2017-10-20 ENCOUNTER — Ambulatory Visit: Payer: Medicaid Other | Admitting: Obstetrics & Gynecology

## 2018-03-15 ENCOUNTER — Other Ambulatory Visit: Payer: Self-pay | Admitting: Obstetrics and Gynecology

## 2018-03-15 DIAGNOSIS — Z1231 Encounter for screening mammogram for malignant neoplasm of breast: Secondary | ICD-10-CM

## 2018-04-13 ENCOUNTER — Ambulatory Visit
Admission: RE | Admit: 2018-04-13 | Discharge: 2018-04-13 | Disposition: A | Discharge status: Home / Self Care | Payer: Medicaid Other | Source: Ambulatory Visit | Attending: Obstetrics and Gynecology | Admitting: Obstetrics and Gynecology

## 2018-04-13 ENCOUNTER — Other Ambulatory Visit: Payer: Self-pay

## 2018-04-13 DIAGNOSIS — Z1231 Encounter for screening mammogram for malignant neoplasm of breast: Secondary | ICD-10-CM

## 2018-04-25 ENCOUNTER — Telehealth: Payer: Self-pay | Admitting: Obstetrics & Gynecology

## 2018-04-25 NOTE — Telephone Encounter (Signed)
The patient called in about the upcoming appointment. Explained to the patient our scripting and about annual visits. The patient became very upset. Stated she did not want to schedule an appointment with the vitual visit because she doctor will not call her and they cant do anything for her. She also became very upset about being scheduled in May informed the patient I would place her on the wait list and be sure that I follow up on the scheduling. She then told she does not want me to follow up on the appointment as I may not be working here in May. Informed the patient that placing her on the wait list will also appear to my co-workers.

## 2018-04-27 ENCOUNTER — Ambulatory Visit: Payer: Medicaid Other | Admitting: Obstetrics and Gynecology

## 2018-08-18 ENCOUNTER — Other Ambulatory Visit: Payer: Self-pay

## 2018-08-18 ENCOUNTER — Other Ambulatory Visit: Payer: Self-pay | Admitting: Podiatry

## 2018-08-18 ENCOUNTER — Ambulatory Visit: Payer: Medicaid Other | Admitting: Podiatry

## 2018-08-18 ENCOUNTER — Ambulatory Visit (INDEPENDENT_AMBULATORY_CARE_PROVIDER_SITE_OTHER): Payer: Medicaid Other

## 2018-08-18 VITALS — Temp 98.0°F

## 2018-08-18 DIAGNOSIS — M216X1 Other acquired deformities of right foot: Secondary | ICD-10-CM

## 2018-08-18 DIAGNOSIS — M722 Plantar fascial fibromatosis: Secondary | ICD-10-CM

## 2018-08-18 DIAGNOSIS — M79671 Pain in right foot: Secondary | ICD-10-CM

## 2018-08-18 DIAGNOSIS — M7731 Calcaneal spur, right foot: Secondary | ICD-10-CM

## 2018-08-18 MED ORDER — MELOXICAM 15 MG PO TABS: 15.0000 mg | ORAL_TABLET | Freq: Every day | ORAL | 0 refills | Status: DC

## 2018-08-18 NOTE — Progress Notes (Signed)
  Subjective:  Patient ID: Sara Pratt, female    DOB: July 17, 1975,  MRN: [id number]  Chief Complaint  Patient presents with  . Foot Pain    Pt states right entire heel pain 64mo duration, no known injury.    43 y.o. female presents with the above complaint. Reports heel pain deep to the bone.  Review of Systems: Negative except as noted in the HPI. Denies N/V/F/Ch.  Past Medical History:  Diagnosis Date  . Anxiety   . Arthritis   . Asthma   . GERD (gastroesophageal reflux disease)   . Pneumonia   . PONV (postoperative nausea and vomiting)   . Scoliosis    No current outpatient medications on file.  Social History   Tobacco Use  Smoking Status Current Some Day Smoker  . Packs/day: 2.00  . Years: 8.00  . Pack years: 16.00  . Types: Cigarettes  Smokeless Tobacco Never Used    No Known Allergies Objective:   Vitals:   08/18/18 0912  Temp: 69 F (36.7 C)   There is no height or weight on file to calculate BMI. Constitutional Well developed. Well nourished.  Vascular Dorsalis pedis pulses palpable bilaterally. Posterior tibial pulses palpable bilaterally. Capillary refill normal to all digits.  No cyanosis or clubbing noted. Pedal hair growth normal.  Neurologic Normal speech. Oriented to person, place, and time. Epicritic sensation to light touch grossly present bilaterally.  Dermatologic Nails well groomed and normal in appearance. No open wounds. No skin lesions.  Orthopedic: Normal joint ROM without pain or crepitus bilaterally. No visible deformities. Tender to palpation at the calcaneal tuber right. No pain with calcaneal squeeze right. Ankle ROM diminished range of motion right. Silfverskiold Test: positive right.   Radiographs: Taken and reviewed. No acute fractures or dislocations. No evidence of stress fracture.  Plantar heel spur present. Posterior heel spur absent.   Assessment:   1. Plantar fasciitis   2. Acquired equinus deformity of  right foot   3. Heel spur, right    Plan:  Patient was evaluated and treated and all questions answered.  Plantar Fasciitis, right - XR reviewed as above.  - Educated on icing and stretching. Instructions given.  - Injection delivered to the plantar fascia as below. - Pharmacologic management: Meloxicam. Educated on risks/benefits and proper taking of medication.  Procedure: Injection Tendon/Ligament Location: Right plantar fascia at the glabrous junction; medial approach. Skin Prep: alcohol Injectate: 1 cc 0.5% marcaine plain, 1 cc dexamethasone phosphate, 0.5 cc kenalog 10. Disposition: Patient tolerated procedure well. Injection site dressed with a band-aid.  Return in about 6 weeks (around 09/29/2018) for Plantar fasciitis, Right.

## 2018-08-18 NOTE — Patient Instructions (Addendum)

## 2018-09-29 ENCOUNTER — Telehealth: Payer: Self-pay | Admitting: Podiatry

## 2018-09-29 ENCOUNTER — Ambulatory Visit: Payer: Medicaid Other | Admitting: Podiatry

## 2018-09-29 ENCOUNTER — Other Ambulatory Visit: Payer: Self-pay

## 2018-09-29 DIAGNOSIS — M722 Plantar fascial fibromatosis: Secondary | ICD-10-CM

## 2018-09-29 MED ORDER — MELOXICAM 15 MG PO TABS: 15.0000 mg | ORAL_TABLET | Freq: Every day | ORAL | 0 refills | Status: DC

## 2018-09-29 MED ORDER — DICLOFENAC SODIUM 75 MG PO TBEC: 75.0000 mg | DELAYED_RELEASE_TABLET | Freq: Two times a day (BID) | ORAL | 0 refills | Status: DC

## 2018-09-29 NOTE — Telephone Encounter (Signed)
Pt medication is not covered under insurance, she is wondering if the medication can be switch.

## 2018-09-29 NOTE — Telephone Encounter (Signed)
That's fine we can do that. Can you Rx?

## 2018-09-29 NOTE — Progress Notes (Signed)
  Subjective:  Patient ID: Sara Pratt, female    DOB: October 09, 1975,  MRN: [id number]  Chief Complaint  Patient presents with  . Plantar Fasciitis    Pt states injection only helped for a few days and meloxicam is ineffective.     43 y.o. female presents with the above complaint. Hx as above.  Review of Systems: Negative except as noted in the HPI. Denies N/V/F/Ch.  Past Medical History:  Diagnosis Date  . Anxiety   . Arthritis   . Asthma   . GERD (gastroesophageal reflux disease)   . Pneumonia   . PONV (postoperative nausea and vomiting)   . Scoliosis     Current Outpatient Medications:  .  diclofenac (VOLTAREN) 75 MG EC tablet, Take 1 tablet (75 mg total) by mouth 2 (two) times daily. With food, Disp: 60 tablet, Rfl: 0  Social History   Tobacco Use  Smoking Status Current Some Day Smoker  . Packs/day: 2.00  . Years: 8.00  . Pack years: 16.00  . Types: Cigarettes  Smokeless Tobacco Never Used    No Known Allergies Objective:   There were no vitals filed for this visit. There is no height or weight on file to calculate BMI. Constitutional Well developed. Well nourished.  Vascular Dorsalis pedis pulses palpable bilaterally. Posterior tibial pulses palpable bilaterally. Capillary refill normal to all digits.  No cyanosis or clubbing noted. Pedal hair growth normal.  Neurologic Normal speech. Oriented to person, place, and time. Epicritic sensation to light touch grossly present bilaterally.  Dermatologic Nails well groomed and normal in appearance. No open wounds. No skin lesions.  Orthopedic: Continued decreased ROM right ankle, POP R heel.   Radiographs: None.  Assessment:   1. Plantar fasciitis    Plan:  Patient was evaluated and treated and all questions answered.  Plantar Fasciitis, right - Repeat injection as below. - Pharmacologic management: Diclofenac. D/c meloxicam  Procedure: Injection Tendon/Ligament Consent: Verbal consent obtained.  Location: Right plantar fascia at the glabrous junction; medial approach. Skin Prep: Alcohol. Injectate: 1 cc 0.5% marcaine plain, 1 cc dexamethasone phosphate, 0.5 cc kenalog 10. Disposition: Patient tolerated procedure well. Injection site dressed with a band-aid.  Return in about 6 weeks (around 11/10/2018) for Plantar fasciitis.

## 2018-09-29 NOTE — Telephone Encounter (Addendum)
I informed Dr. March Rummage pt states her insurance will not cover the diclofenac and meloxicam was just discontinued due to ineffective.

## 2018-09-29 NOTE — Addendum Note (Signed)
Addended by: Harriett Sine D on: 09/29/2018 03:20 PM   Modules accepted: Orders

## 2018-09-30 MED ORDER — NAPROXEN 500 MG PO TABS: 500.0000 mg | ORAL_TABLET | Freq: Two times a day (BID) | ORAL | 0 refills | Status: DC

## 2018-09-30 NOTE — Telephone Encounter (Signed)
I informed pt of Dr. Eleanora Neighbor order for Naprosyn.

## 2018-09-30 NOTE — Addendum Note (Signed)
Addended by: Harriett Sine D on: 09/30/2018 09:22 AM   Modules accepted: Orders

## 2018-10-25 ENCOUNTER — Other Ambulatory Visit: Payer: Self-pay

## 2018-10-25 MED ORDER — MELOXICAM 15 MG PO TABS: 15.0000 mg | ORAL_TABLET | Freq: Every day | ORAL | 0 refills | Status: DC

## 2018-11-10 ENCOUNTER — Ambulatory Visit: Payer: Medicaid Other | Admitting: Podiatry

## 2018-11-10 ENCOUNTER — Other Ambulatory Visit: Payer: Self-pay

## 2018-11-10 DIAGNOSIS — M722 Plantar fascial fibromatosis: Secondary | ICD-10-CM

## 2018-12-22 ENCOUNTER — Ambulatory Visit: Payer: Medicaid Other | Admitting: Podiatry

## 2018-12-22 ENCOUNTER — Other Ambulatory Visit: Payer: Self-pay

## 2018-12-22 DIAGNOSIS — M722 Plantar fascial fibromatosis: Secondary | ICD-10-CM | POA: Diagnosis not present

## 2018-12-22 DIAGNOSIS — M216X1 Other acquired deformities of right foot: Secondary | ICD-10-CM

## 2018-12-22 MED ORDER — DICLOFENAC SODIUM 75 MG PO TBEC: 75.0000 mg | DELAYED_RELEASE_TABLET | Freq: Two times a day (BID) | ORAL | 0 refills | Status: DC

## 2019-01-15 NOTE — Progress Notes (Signed)
  Subjective:  Patient ID: Sara Pratt, female    DOB: 03-03-1975,  MRN: [id number]  Chief Complaint  Patient presents with  . Plantar Fasciitis    Pt states improvement, and that it may be related to purchasing new shoes or her previous injection. Pt states not 100% better but definitely improved.    43 y.o. female presents with the above complaint. Hx as above.  Review of Systems: Negative except as noted in the HPI. Denies N/V/F/Ch.  Past Medical History:  Diagnosis Date  . Anxiety   . Arthritis   . Asthma   . GERD (gastroesophageal reflux disease)   . Pneumonia   . PONV (postoperative nausea and vomiting)   . Scoliosis     Current Outpatient Medications:  .  meloxicam (MOBIC) 15 MG tablet, Take 1 tablet (15 mg total) by mouth daily., Disp: 30 tablet, Rfl: 0 .  naproxen (NAPROSYN) 500 MG tablet, Take 1 tablet (500 mg total) by mouth 2 (two) times daily with a meal., Disp: 60 tablet, Rfl: 0 .  diclofenac (VOLTAREN) 75 MG EC tablet, Take 1 tablet (75 mg total) by mouth 2 (two) times daily., Disp: 30 tablet, Rfl: 0  Social History   Tobacco Use  Smoking Status Current Some Day Smoker  . Packs/day: 2.00  . Years: 8.00  . Pack years: 16.00  . Types: Cigarettes  Smokeless Tobacco Never Used    No Known Allergies Objective:   There were no vitals filed for this visit. There is no height or weight on file to calculate BMI. Constitutional Well developed. Well nourished.  Vascular Dorsalis pedis pulses palpable bilaterally. Posterior tibial pulses palpable bilaterally. Capillary refill normal to all digits.  No cyanosis or clubbing noted. Pedal hair growth normal.  Neurologic Normal speech. Oriented to person, place, and time. Epicritic sensation to light touch grossly present bilaterally.  Dermatologic Nails well groomed and normal in appearance. No open wounds. No skin lesions.  Orthopedic: Continued decreased ROM right ankle, slight POP R heel.    Radiographs: None.  Assessment:   1. Plantar fasciitis   2. Acquired equinus deformity of right foot    Plan:  Patient was evaluated and treated and all questions answered.  Plantar Fasciitis, right - Continue stretching icing order diclofenac -Follow-up should pain persist for repeat injection  Return if symptoms worsen or fail to improve.

## 2019-04-03 ENCOUNTER — Other Ambulatory Visit: Payer: Self-pay | Admitting: Obstetrics and Gynecology

## 2019-04-03 DIAGNOSIS — Z1231 Encounter for screening mammogram for malignant neoplasm of breast: Secondary | ICD-10-CM

## 2019-04-18 NOTE — Progress Notes (Signed)
  Subjective:  Patient ID: Sara Pratt, female    DOB: Aug 14, 1975,  MRN: [id number]  Chief Complaint  Patient presents with  . Plantar Fasciitis    Pt states injections have helped but naproxen did nothing.    44 y.o. female presents with the above complaint. Hx as above.  Review of Systems: Negative except as noted in the HPI. Denies N/V/F/Ch.  Past Medical History:  Diagnosis Date  . Anxiety   . Arthritis   . Asthma   . GERD (gastroesophageal reflux disease)   . Pneumonia   . PONV (postoperative nausea and vomiting)   . Scoliosis     Current Outpatient Medications:  .  meloxicam (MOBIC) 15 MG tablet, Take 1 tablet (15 mg total) by mouth daily., Disp: 30 tablet, Rfl: 0 .  naproxen (NAPROSYN) 500 MG tablet, Take 1 tablet (500 mg total) by mouth 2 (two) times daily with a meal., Disp: 60 tablet, Rfl: 0 .  diclofenac (VOLTAREN) 75 MG EC tablet, Take 1 tablet (75 mg total) by mouth 2 (two) times daily., Disp: 30 tablet, Rfl: 0  Social History   Tobacco Use  Smoking Status Current Some Day Smoker  . Packs/day: 2.00  . Years: 8.00  . Pack years: 16.00  . Types: Cigarettes  Smokeless Tobacco Never Used    No Known Allergies Objective:   There were no vitals filed for this visit. There is no height or weight on file to calculate BMI. Constitutional Well developed. Well nourished.  Vascular Dorsalis pedis pulses palpable bilaterally. Posterior tibial pulses palpable bilaterally. Capillary refill normal to all digits.  No cyanosis or clubbing noted. Pedal hair growth normal.  Neurologic Normal speech. Oriented to person, place, and time. Epicritic sensation to light touch grossly present bilaterally.  Dermatologic Nails well groomed and normal in appearance. No open wounds. No skin lesions.  Orthopedic: Continued decreased ROM right ankle, POP R heel.   Radiographs: None.  Assessment:   1. Plantar fasciitis    Plan:  Patient was evaluated and treated and  all questions answered.  Plantar Fasciitis, right - Final injection as below   Procedure: Injection Tendon/Ligament Consent: Verbal consent obtained. Location: Right plantar fascia at the glabrous junction; medial approach. Skin Prep: Alcohol. Injectate: 1 cc 0.5% marcaine plain, 1 cc dexamethasone phosphate, 0.5 cc kenalog 10. Disposition: Patient tolerated procedure well. Injection site dressed with a band-aid.     Return in about 6 weeks (around 12/22/2018).

## 2019-04-19 ENCOUNTER — Ambulatory Visit
Admission: RE | Admit: 2019-04-19 | Discharge: 2019-04-19 | Disposition: A | Discharge status: Home / Self Care | Payer: Medicaid Other | Source: Ambulatory Visit | Attending: Obstetrics and Gynecology | Admitting: Obstetrics and Gynecology

## 2019-04-19 ENCOUNTER — Other Ambulatory Visit: Payer: Self-pay

## 2019-04-19 DIAGNOSIS — Z1231 Encounter for screening mammogram for malignant neoplasm of breast: Secondary | ICD-10-CM

## 2019-04-20 ENCOUNTER — Other Ambulatory Visit: Payer: Self-pay | Admitting: Obstetrics and Gynecology

## 2019-04-20 DIAGNOSIS — R928 Other abnormal and inconclusive findings on diagnostic imaging of breast: Secondary | ICD-10-CM

## 2019-05-08 ENCOUNTER — Ambulatory Visit
Admission: RE | Admit: 2019-05-08 | Discharge: 2019-05-08 | Disposition: A | Discharge status: Home / Self Care | Payer: Medicaid Other | Source: Ambulatory Visit | Attending: Obstetrics and Gynecology | Admitting: Obstetrics and Gynecology

## 2019-05-08 ENCOUNTER — Other Ambulatory Visit: Payer: Self-pay | Admitting: Obstetrics and Gynecology

## 2019-05-08 ENCOUNTER — Other Ambulatory Visit: Payer: Self-pay

## 2019-05-08 DIAGNOSIS — R928 Other abnormal and inconclusive findings on diagnostic imaging of breast: Secondary | ICD-10-CM

## 2019-06-06 ENCOUNTER — Encounter: Payer: Self-pay | Admitting: *Deleted

## 2019-10-12 ENCOUNTER — Encounter: Payer: Self-pay | Admitting: *Deleted

## 2019-11-08 ENCOUNTER — Other Ambulatory Visit: Payer: Self-pay | Admitting: Obstetrics and Gynecology

## 2019-11-08 ENCOUNTER — Ambulatory Visit
Admission: RE | Admit: 2019-11-08 | Discharge: 2019-11-08 | Disposition: A | Discharge status: Home / Self Care | Payer: Medicaid Other | Source: Ambulatory Visit | Attending: Obstetrics and Gynecology | Admitting: Obstetrics and Gynecology

## 2019-11-08 ENCOUNTER — Other Ambulatory Visit: Payer: Self-pay

## 2019-11-08 DIAGNOSIS — R928 Other abnormal and inconclusive findings on diagnostic imaging of breast: Secondary | ICD-10-CM

## 2019-11-17 ENCOUNTER — Other Ambulatory Visit: Payer: Self-pay

## 2019-11-17 ENCOUNTER — Other Ambulatory Visit: Payer: Self-pay | Admitting: Obstetrics and Gynecology

## 2019-11-17 ENCOUNTER — Ambulatory Visit
Admission: RE | Admit: 2019-11-17 | Discharge: 2019-11-17 | Disposition: A | Discharge status: Home / Self Care | Payer: Medicaid Other | Source: Ambulatory Visit | Attending: Obstetrics and Gynecology | Admitting: Obstetrics and Gynecology

## 2019-11-17 DIAGNOSIS — R928 Other abnormal and inconclusive findings on diagnostic imaging of breast: Secondary | ICD-10-CM

## 2019-12-25 ENCOUNTER — Other Ambulatory Visit: Payer: Self-pay

## 2019-12-25 ENCOUNTER — Ambulatory Visit (INDEPENDENT_AMBULATORY_CARE_PROVIDER_SITE_OTHER): Payer: Medicaid Other | Admitting: Certified Nurse Midwife

## 2019-12-25 ENCOUNTER — Encounter: Payer: Self-pay | Admitting: Certified Nurse Midwife

## 2019-12-25 VITALS — BP 130/78 | HR 95 | Ht 64.0 in | Wt 190.0 lb

## 2019-12-25 DIAGNOSIS — Z01419 Encounter for gynecological examination (general) (routine) without abnormal findings: Secondary | ICD-10-CM | POA: Diagnosis not present

## 2019-12-25 NOTE — Progress Notes (Addendum)
GYNECOLOGY ANNUAL PREVENTATIVE CARE ENCOUNTER NOTE  History:     Sara Pratt is a 44 y.o. G6P5 female here for a routine annual gynecologic exam.  Current complaints: nothing, patient request diet pills.   Denies discharge, pelvic pain, problems with intercourse or other gynecologic concerns.    Gynecologic History No LMP recorded. Patient has had a hysterectomy. Contraception: status post hysterectomy Last mammogram: 10/21  Obstetric History OB History  Gravida Para Term Preterm AB Living  6         5  SAB TAB Ectopic Multiple Live Births               # Outcome Date GA Lbr Len/2nd Weight Sex Delivery Anes PTL Lv  6 Gravida           5 Gravida           4 Gravida           3 Gravida           2 Gravida           1 [place]             Past Medical History:  Diagnosis Date  . Anxiety   . Arthritis   . Asthma   . GERD (gastroesophageal reflux disease)   . Pneumonia   . PONV (postoperative nausea and vomiting)   . Scoliosis     Past Surgical History:  Procedure Laterality Date  . ABDOMINAL HYSTERECTOMY    . ABDOMINOPLASTY    . APPENDECTOMY    . CESAREAN SECTION    . CHOLECYSTECTOMY    . HERNIA REPAIR    . LAPAROSCOPY  11/12/2015   Procedure: LAPAROSCOPY DIAGNOSTIC;  Surgeon: Mora Bellman, MD;  Location: [facility];  Service: Gynecology;;  . LYSIS OF ADHESION  11/12/2015   Procedure: LYSIS OF ADHESION;  Surgeon: Mora Bellman, MD;  Location: [facility];  Service: Gynecology;;  . LYSIS OF ADHESION Left 03/03/2016   Procedure: LYSIS OF ADHESION;  Surgeon: Everitt Amber, MD;  Location: [facility];  Service: Gynecology;  Laterality: Left;  . ROBOTIC ASSISTED BILATERAL SALPINGO OOPHERECTOMY Left 03/03/2016   Procedure: XI ROBOTIC ASSISTED LEFT  SALPINGO OOPHORECTOMY;  Surgeon: Everitt Amber, MD;  Location: [facility];  Service: Gynecology;  Laterality: Left;    Current Outpatient Medications on File Prior to Visit  Medication Sig Dispense Refill  . ondansetron (ZOFRAN-ODT) 4  MG disintegrating tablet Take 4 mg by mouth every 8 (eight) hours as needed for nausea or vomiting.    Marland Kitchen PRESCRIPTION MEDICATION Taking something for acid reflux, unsure of name     No current facility-administered medications on file prior to visit.    Allergies  Allergen Reactions  . Nsaids Other (See Comments)    Has hernia, was told not to take    Social History:  reports that she has been smoking cigarettes. She has a 16.00 pack-year smoking history. She has never used smokeless tobacco. She reports current alcohol use. She reports that she does not use drugs.  Family History  Problem Relation Age of Onset  . Breast cancer Mother     The following portions of the patient's history were reviewed and updated as appropriate: allergies, current medications, past family history, past medical history, past social history, past surgical history and problem list.  Review of Systems Pertinent items noted in HPI and remainder of comprehensive ROS otherwise negative.  Physical Exam:  BP 130/78   Pulse 95  Ht 5\' 4"  (1.626 m)   Wt 190 lb (86.2 kg)   BMI 32.61 kg/m  CONSTITUTIONAL: Well-developed, well-nourished female in no acute distress.  HENT:  Normocephalic, atraumatic, External right and left ear normal.  EYES: Conjunctivae and EOM are normal. Pupils are equal, round, and reactive to light.  NECK: Normal range of motion, supple, no masses.  Normal thyroid.  SKIN: Skin is warm and dry. No rash noted. Not diaphoretic. No erythema. No pallor. MUSCULOSKELETAL: Normal range of motion. No tenderness.  No cyanosis, clubbing, or edema.   NEUROLOGIC: Alert and oriented to person, place, and time. Normal reflexes, muscle tone coordination.  PSYCHIATRIC: Normal mood and affect. Normal behavior. Normal judgment and thought content. CARDIOVASCULAR: Normal heart rate noted, regular rhythm RESPIRATORY: Clear to auscultation bilaterally. Effort and breath sounds normal, no problems with  respiration noted. BREASTS: Symmetric in size. No tenderness, skin changes, nipple drainage, or lymphadenopathy bilaterally. Small 1cm mass palpated in right breast- s/p biopsy that was normal. Performed in the presence of a chaperone.  ABDOMEN: Soft, no distention noted.  No tenderness, rebound or guarding.  PELVIC: Normal appearing external genitalia and urethral meatus; Small amount of white  discharge noted.   Performed in the presence of a chaperone.   Assessment and Plan:    1. Women's annual routine gynecological examination - pap smear not done today, patient has hysterectomy unsure if cervix was taken - do not have op note from hysterectomy.  - pelvic examination noted small amount of white discharge without odor and no cervix identified at this time  - Normal well woman examination - Referral to primary care  - patient reports that she is mainly here for diet pills, discussed with patient that we do not prescribe diet pills  - Ambulatory referral to [facility]  Routine preventative health maintenance measures emphasized. Please refer to After Visit Summary for other counseling recommendations.      Lajean Manes, [place] for [employer], [place]

## 2020-08-13 ENCOUNTER — Ambulatory Visit: Payer: Medicaid Other | Admitting: Podiatry

## 2020-08-13 ENCOUNTER — Other Ambulatory Visit: Payer: Self-pay

## 2020-08-13 DIAGNOSIS — M722 Plantar fascial fibromatosis: Secondary | ICD-10-CM

## 2020-08-13 MED ORDER — BETAMETHASONE SOD PHOS & ACET 6 (3-3) MG/ML IJ SUSP
12.0000 mg | Freq: Once | INTRAMUSCULAR | Status: AC
  Administered 2020-08-13: 12 mg

## 2020-08-13 NOTE — Progress Notes (Signed)
  Subjective:  Patient ID: Sara Pratt, female    DOB: 08/29/1975,  MRN: [id number]  Chief Complaint  Patient presents with   Plantar Fasciitis    Bilateral PF. Pain located at the arch heel and lateral side of both feet. Aching pains. Pt requested an injection.    45 y.o. female presents with the above complaint. History confirmed with patient.   Objective:  Physical Exam: warm, good capillary refill, no trophic changes or ulcerative lesions, normal DP and PT pulses, and normal sensory exam. Left Foot: tenderness to palpation medial calcaneal tuber, no pain with calcaneal squeeze, decreased ankle joint ROM, and +Silverskiold test Right Foot: tenderness to palpation medial calcaneal tuber, no pain with calcaneal squeeze, decreased ankle joint ROM, and +Silverskiold test normal exam, no swelling, tenderness, instability; ligaments intact, full range of motion of all ankle/foot joints other than findings noted above.   Assessment:   1. Plantar fasciitis    Plan:  Patient was evaluated and treated and all questions answered.  Plantar Fasciitis -Educated patient on stretching and icing of the affected limb -Injection delivered to the plantar fascia of both feet.  Procedure: Injection Tendon/Ligament Consent: Verbal consent obtained. Location: Bilateral plantar fascia at the glabrous junction; medial approach. Skin Prep: Alcohol. Injectate: 1 cc 0.5% marcaine plain, 1 cc betamethasone acetate-betamethasone sodium phosphate Disposition: Patient tolerated procedure well. Injection site dressed with a band-aid.  No follow-ups on file.

## 2020-09-13 ENCOUNTER — Other Ambulatory Visit: Payer: Self-pay

## 2020-09-13 ENCOUNTER — Ambulatory Visit (INDEPENDENT_AMBULATORY_CARE_PROVIDER_SITE_OTHER): Payer: Medicaid Other | Admitting: Podiatry

## 2020-09-13 DIAGNOSIS — M216X1 Other acquired deformities of right foot: Secondary | ICD-10-CM | POA: Diagnosis not present

## 2020-09-13 DIAGNOSIS — M722 Plantar fascial fibromatosis: Secondary | ICD-10-CM

## 2020-09-13 MED ORDER — METHYLPREDNISOLONE 4 MG PO TBPK: ORAL_TABLET | ORAL | 0 refills | Status: DC

## 2020-09-13 NOTE — Progress Notes (Signed)
  Subjective:  Patient ID: Sara Pratt, female    DOB: 1975/11/24,  MRN: [id number]  Chief Complaint  Patient presents with   Plantar Fasciitis    Pt states injection ineffective and she is still experiencing some pain.   45 y.o. female presents with the above complaint. History confirmed with patient.   Objective:  Physical Exam: warm, good capillary refill, no trophic changes or ulcerative lesions, normal DP and PT pulses, and normal sensory exam. Left Foot: tenderness to palpation medial calcaneal tuber, no pain with calcaneal squeeze, decreased ankle joint ROM, and +Silverskiold test Right Foot: tenderness to palpation medial calcaneal tuber, no pain with calcaneal squeeze, decreased ankle joint ROM, and +Silverskiold test normal exam, no swelling, tenderness, instability; ligaments intact, full range of motion of all ankle/foot joints other than findings noted above.   Assessment:   1. Plantar fasciitis   2. Acquired equinus deformity of right foot    Plan:  Patient was evaluated and treated and all questions answered.  Plantar Fasciitis -Rx medrol -Discussed good shoe gear to prevent continued pain -Should issues persist consider MRI and surgical intervention   Return in about 4 weeks (around 10/11/2020) for Plantar fasciitis.

## 2020-10-18 ENCOUNTER — Other Ambulatory Visit: Payer: Self-pay | Admitting: Podiatry

## 2020-10-18 ENCOUNTER — Ambulatory Visit: Payer: Medicaid Other | Admitting: Podiatry

## 2020-10-18 ENCOUNTER — Other Ambulatory Visit: Payer: Self-pay

## 2020-10-18 DIAGNOSIS — M722 Plantar fascial fibromatosis: Secondary | ICD-10-CM | POA: Diagnosis not present

## 2020-10-18 DIAGNOSIS — M216X1 Other acquired deformities of right foot: Secondary | ICD-10-CM

## 2020-10-18 MED ORDER — MELOXICAM 15 MG PO TABS: 15.0000 mg | ORAL_TABLET | Freq: Every day | ORAL | 0 refills | Status: DC

## 2020-10-18 NOTE — Progress Notes (Signed)
  Subjective:  Patient ID: Sara Pratt, female    DOB: 1975/11/22,  MRN: [id number]  Chief Complaint  Patient presents with   Plantar Fasciitis    Pt states still experiencing plantar heel pain both feet   45 y.o. female presents with the above complaint. History confirmed with patient. States the heels are hurting and not getting any better.  Objective:  Physical Exam: warm, good capillary refill, no trophic changes or ulcerative lesions, normal DP and PT pulses, and normal sensory exam. Left Foot: tenderness to palpation medial calcaneal tuber, no pain with calcaneal squeeze, decreased ankle joint ROM, and +Silverskiold test Right Foot: tenderness to palpation medial calcaneal tuber, no pain with calcaneal squeeze, decreased ankle joint ROM, and +Silverskiold test normal exam, no swelling, tenderness, instability; ligaments intact, full range of motion of all ankle/foot joints other than findings noted above.   Assessment:   1. Plantar fasciitis   2. Acquired equinus deformity of right foot    Plan:  Patient was evaluated and treated and all questions answered.  Plantar Fasciitis -Rx meloxicam -Hold injection today -Given chronicity of symptoms and failure with conservative therapy ordered MRI to evaluate. May benefit from plantar fascial release  Return in about 3 weeks (around 11/08/2020) for MRI review.

## 2020-10-24 ENCOUNTER — Telehealth: Payer: Self-pay | Admitting: Podiatry

## 2020-10-24 NOTE — Telephone Encounter (Signed)
Patient called the office stating she has received a call to schedule her MRI. I contact [facility] and spoke with [place] , and she stated that she would give Mrs. Tino a call to schedule her MRI.  I will follow back up with Mrs. Miles before 5pm.

## 2020-10-30 ENCOUNTER — Other Ambulatory Visit: Payer: Self-pay

## 2020-10-30 ENCOUNTER — Ambulatory Visit
Admission: RE | Admit: 2020-10-30 | Discharge: 2020-10-30 | Disposition: A | Discharge status: Home / Self Care | Payer: Medicaid Other | Source: Ambulatory Visit | Attending: Podiatry | Admitting: Podiatry

## 2020-10-30 DIAGNOSIS — M722 Plantar fascial fibromatosis: Secondary | ICD-10-CM

## 2020-11-07 ENCOUNTER — Encounter: Payer: Self-pay | Admitting: Internal Medicine

## 2020-11-07 ENCOUNTER — Ambulatory Visit: Payer: Medicaid Other | Admitting: Internal Medicine

## 2020-11-07 ENCOUNTER — Other Ambulatory Visit: Payer: Self-pay

## 2020-11-07 VITALS — BP 123/83 | HR 76 | Temp 98.2°F | Resp 28 | Ht 64.0 in | Wt 204.2 lb

## 2020-11-07 DIAGNOSIS — Z0000 Encounter for general adult medical examination without abnormal findings: Secondary | ICD-10-CM | POA: Diagnosis present

## 2020-11-07 DIAGNOSIS — G8929 Other chronic pain: Secondary | ICD-10-CM | POA: Diagnosis not present

## 2020-11-07 DIAGNOSIS — M25572 Pain in left ankle and joints of left foot: Secondary | ICD-10-CM | POA: Diagnosis not present

## 2020-11-07 DIAGNOSIS — J45909 Unspecified asthma, uncomplicated: Secondary | ICD-10-CM

## 2020-11-07 DIAGNOSIS — E669 Obesity, unspecified: Secondary | ICD-10-CM

## 2020-11-07 DIAGNOSIS — Z789 Other specified health status: Secondary | ICD-10-CM | POA: Diagnosis not present

## 2020-11-07 DIAGNOSIS — Z1239 Encounter for other screening for malignant neoplasm of breast: Secondary | ICD-10-CM

## 2020-11-07 LAB — POCT GLYCOSYLATED HEMOGLOBIN (HGB A1C): Hemoglobin A1C: 5.7 % — AB (ref 4.0–5.6)

## 2020-11-07 LAB — GLUCOSE, CAPILLARY: Glucose-Capillary: 80 mg/dL (ref 70–99)

## 2020-11-07 MED ORDER — ALBUTEROL SULFATE HFA 108 (90 BASE) MCG/ACT IN AERS: 2.0000 | INHALATION_SPRAY | Freq: Four times a day (QID) | RESPIRATORY_TRACT | 2 refills | Status: DC | PRN

## 2020-11-07 MED ORDER — DICLOFENAC SODIUM 1 % EX GEL: 2.0000 g | Freq: Four times a day (QID) | CUTANEOUS | 0 refills | Status: DC

## 2020-11-07 MED ORDER — SEMAGLUTIDE 7 MG PO TABS: 7.0000 mg | ORAL_TABLET | Freq: Every day | ORAL | 0 refills | Status: DC

## 2020-11-07 NOTE — Progress Notes (Signed)
Established Patient Office Visit  Subjective:  Patient ID: Sara Pratt, female    DOB: 1975/06/26  Age: 45 y.o. MRN: [id number]  CC: Establish care, left ankle pain and recent weight gain  HPI Sara Pratt presents for cc listed above. Patient states for the past three months she has gained over 24lbs. Patient noticed that after quitting smoking three months ago, she has been "snacking" more such as eating bags of chips between meals. Patient has a history of plantar fascitis and [facility]. Patient reports her foot pain is now around her left ankle. Patient report associated numbness/tingling around base of toes on the left. She had a recent MRI of the left ankle and will be following up with [facility], Friday November 08, 2020 for further evaluation. Patient is interested in weight loss management. Patient requested refill of albuterol inhaler, states she has run out.   Past Medical History:  Diagnosis Date   Anxiety    Arthritis    Asthma    GERD (gastroesophageal reflux disease)    Pneumonia    PONV (postoperative nausea and vomiting)    Scoliosis     Past Surgical History:  Procedure Laterality Date   ABDOMINAL HYSTERECTOMY     ABDOMINOPLASTY     APPENDECTOMY     CESAREAN SECTION     CHOLECYSTECTOMY     HERNIA REPAIR     LAPAROSCOPY  11/12/2015   Procedure: LAPAROSCOPY DIAGNOSTIC;  Surgeon: Mora Bellman, MD;  Location: [facility];  Service: Gynecology;;   LYSIS OF ADHESION  11/12/2015   Procedure: LYSIS OF ADHESION;  Surgeon: Mora Bellman, MD;  Location: [facility];  Service: Gynecology;;   LYSIS OF ADHESION Left 03/03/2016   Procedure: LYSIS OF ADHESION;  Surgeon: Everitt Amber, MD;  Location: [facility];  Service: Gynecology;  Laterality: Left;   ROBOTIC ASSISTED BILATERAL SALPINGO OOPHERECTOMY Left 03/03/2016   Procedure: XI ROBOTIC ASSISTED LEFT  SALPINGO OOPHORECTOMY;  Surgeon: Everitt Amber, MD;  Location: [facility];  Service: Gynecology;  Laterality:  Left;    Family History  Problem Relation Age of Onset   Breast cancer Mother     Social History   Socioeconomic History   Marital status: Divorced    Spouse name: Not on file   Number of children: Not on file   Years of education: Not on file   Highest education level: Not on file  Occupational History   Not on file  Tobacco Use   Smoking status: Former    Packs/day: 2.00    Years: 8.00    Pack years: 16.00    Types: Cigarettes    Quit date: 07/03/2020    Years since quitting: 0.3   Smokeless tobacco: Never  Substance and Sexual Activity   Alcohol use: Yes    Comment: Wine/ , liquor  "depends on the day"   Drug use: No   Sexual activity: Yes    Birth control/protection: None, Surgical  Other Topics Concern   Not on file  Social History Narrative   Not on file   Social Determinants of Health   Financial Resource Strain: Not on file  Food Insecurity: Food Insecurity Present   Worried About [place] in the Last Year: Never true   [place] in the Last Year: Sometimes true  Transportation Needs: No Transportation Needs   Lack of Transportation (Medical): No   Lack of Transportation (Non-Medical): No  Physical Activity: Not  on file  Stress: Not on file  Social Connections: Not on file  Intimate Partner Violence: Not on file    Outpatient Medications Prior to Visit  Medication Sig Dispense Refill   famotidine (PEPCID) 40 MG tablet Take by mouth.     meloxicam (MOBIC) 15 MG tablet TAKE 1 TABLET(15 MG) BY MOUTH DAILY 90 tablet 0   methylPREDNISolone (MEDROL DOSEPAK) 4 MG TBPK tablet 6 Day Taper Pack. Take as Directed. 21 tablet 0   ondansetron (ZOFRAN-ODT) 4 MG disintegrating tablet Take 4 mg by mouth every 8 (eight) hours as needed for nausea or vomiting.     PRESCRIPTION MEDICATION Taking something for acid reflux, unsure of name     No facility-administered medications prior to visit.    Allergies  Allergen Reactions   Nsaids Other (See  Comments)    Has hernia, was told not to take    ROS Review of Systems  Constitutional:  Positive for appetite change. Negative for chills and fever.  HENT:  Negative for sore throat.   Respiratory:  Positive for wheezing. Negative for shortness of breath.   Cardiovascular:  Negative for chest pain and palpitations.  Gastrointestinal:  Negative for abdominal pain, constipation, diarrhea, nausea and vomiting.  Endocrine: Negative for polydipsia and polyuria.  Genitourinary:  Negative for frequency.  Musculoskeletal:  Positive for joint swelling (left ankle).  Neurological:  Negative for dizziness and light-headedness.     Objective:    Physical Exam Constitutional:      General: She is awake.     Appearance: She is overweight.  HENT:     Head: Normocephalic and atraumatic.  Cardiovascular:     Rate and Rhythm: Normal rate and regular rhythm.     Heart sounds: Normal heart sounds.  Pulmonary:     Effort: Pulmonary effort is normal.     Breath sounds: Examination of the left-upper field reveals wheezing. Wheezing present.  Abdominal:     General: Abdomen is protuberant. Bowel sounds are normal.     Palpations: Abdomen is soft.     Hernia: A hernia (epigastric region) is present.  Musculoskeletal:     Comments: Left ankle- mild swelling; full range motion intact  Skin:    General: Skin is warm and dry.  Neurological:     General: No focal deficit present.     Mental Status: She is alert.  Psychiatric:        Behavior: Behavior normal. Behavior is cooperative.    BP 123/83 (BP Location: Left Arm, Cuff Size: Large)   Pulse 76   Temp 98.2 F (36.8 C)   Resp (!) 28   Ht 5\' 4"  (1.626 m)   Wt 204 lb 3.2 oz (92.6 kg)   SpO2 98% Comment: room air  BMI 35.05 kg/m  Wt Readings from Last 3 Encounters:  11/07/20 204 lb 3.2 oz (92.6 kg)  12/25/19 190 lb (86.2 kg)  05/30/17 175 lb (79.4 kg)     Health Maintenance Due  Topic Date Due   COVID-19 Vaccine (1) Never done    HIV Screening  Never done   Hepatitis C Screening  Never done   TETANUS/TDAP  Never done   INFLUENZA VACCINE  Never done    There are no preventive care reminders to display for this patient.  No results found for: TSH Lab Results  Component Value Date   WBC 5.7 05/30/2017   HGB 13.9 05/30/2017   HCT 39.7 05/30/2017   MCV 83.1 05/30/2017  PLT 146 (L) 05/30/2017   Lab Results  Component Value Date   NA 138 05/30/2017   K 3.5 05/30/2017   CO2 23 05/30/2017   GLUCOSE 89 05/30/2017   BUN 16 05/30/2017   CREATININE 0.89 05/30/2017   BILITOT 0.4 02/17/2016   ALKPHOS 29 (L) 02/17/2016   AST 14 (L) 02/17/2016   ALT 12 (L) 02/17/2016   PROT 6.6 02/17/2016   ALBUMIN 3.8 02/17/2016   CALCIUM 9.0 05/30/2017   ANIONGAP 9 05/30/2017   No results found for: CHOL No results found for: HDL No results found for: LDLCALC No results found for: TRIG No results found for: CHOLHDL No results found for: HGBA1C    Assessment & Plan:   Left ankle pain Patient has history of plantar fasciitis.  [facility].  Patient states that she receives monthly injections in her foot for pain relief.  Patient now states that the pain is now around her left ankle.  Patient also reports associated numbness and tingling at the base of her toes on the left foot.  Recent MRI of the left ankle reveals tendinitis and tendosynovitis.  Patient is scheduled to follow-up with [facility] Friday, November 08, 2020.  PLAN: Voltaren gel as needed for pain relief Patient will follow-up with [facility] further evaluation/care  Obesity Patient current BMI 35; patient admits that since quitting smoking 3 months ago she has noticed recent weight gain of about 424 pounds.  Patient states poor diet and eats snacks between meals.  Patient admits she has not she has not been exercising as much recently.  Patient states in the past she has been put on  phentermine for weight loss management back in 2013.  She reports that it helped her lose weight and was interested in being represcribed that medication.  Patient was counseled extensively about proper diet and exercise as an effective method for weight loss.  Patient declined referral to dietitian at this time.  Discussion was had with the patient regarding phentermine side effects and GLP-1 was recommended as it is an approved medication for weight loss and safer.  Patient was adamant about using phentermine instead of other treatment options.  PLAN: Referral to weight loss management clinic CMP Lipid profile Hemoglobin A1c Follow-up in 3 weeks  Asthma Patient has a history of asthma since childhood.  Patient endorses use of albuterol inhaler however she has since run out.  Patient requests refill.  PLAN: Albuterol inhaler prescribed  Alcohol Use Patient states that she consumes 1 pint of liquor per day on the weekends.  Discussion was had with patient about the importance of decreased intake of alcohol due to effects of alcohol on the body.  Patient acknowledges and agrees.   PLAN: CBC with differential CMP Hep C antibody  Healthcare maintenance Patient is new to the clinic today. Significant lab works were ordered today. Patient declined influenza vaccine at this time.  Patient is not due for Tdap at this time.  Patient has history of hysterectomy, Pap smear not necessary.  PLAN: HIV screening       Problem List Items Addressed This Visit   None Visit Diagnoses     Healthcare maintenance    -  Primary   Relevant Orders   HIV antibody (with reflex)   Obesity (BMI 35.0-39.9 without comorbidity)       Relevant Orders   CMP14 + Anion Gap   Lipid  Profile   POC Hbg A1C   Amb Ref to Medical Weight Management   Glucose, capillary (Completed)   Breast cancer screening, high risk patient       Relevant Orders   MM Digital Screening   Alcohol use       Relevant Orders    CBC with Diff   CMP14 + Anion Gap   Hepatitis C antibody   Chronic pain of left ankle       Relevant Medications   diclofenac Sodium (VOLTAREN) 1 % GEL   Asthma, unspecified asthma severity, unspecified whether complicated, unspecified whether persistent       Relevant Medications   albuterol (VENTOLIN HFA) 108 (90 Base) MCG/ACT inhaler       Meds ordered this encounter  Medications   diclofenac Sodium (VOLTAREN) 1 % GEL    Sig: Apply 2 g topically 4 (four) times daily.    Dispense:  2 g    Refill:  0   DISCONTD: Semaglutide 7 MG TABS    Sig: Take 7 mg by mouth daily.    Dispense:  30 tablet    Refill:  0   albuterol (VENTOLIN HFA) 108 (90 Base) MCG/ACT inhaler    Sig: Inhale 2 puffs into the lungs every 6 (six) hours as needed for wheezing or shortness of breath.    Dispense:  8 g    Refill:  2    Follow-up: Return in about 3 weeks (around 11/28/2020).    Timothy Lasso, MD

## 2020-11-08 ENCOUNTER — Telehealth: Payer: Self-pay | Admitting: Podiatry

## 2020-11-08 ENCOUNTER — Ambulatory Visit: Payer: Medicaid Other | Admitting: Podiatry

## 2020-11-08 LAB — LIPID PANEL
Chol/HDL Ratio: 2.6 ratio (ref 0.0–4.4)
Cholesterol, Total: 192 mg/dL (ref 100–199)
HDL: 75 mg/dL (ref >39)
LDL Chol Calc (NIH): 103 mg/dL — ABNORMAL HIGH (ref 0–99)
Triglycerides: 77 mg/dL (ref 0–149)
VLDL Cholesterol Cal: 14 mg/dL (ref 5–40)

## 2020-11-08 LAB — CBC WITH DIFFERENTIAL/PLATELET
Basophils Absolute: 0 10*3/uL (ref 0.0–0.2)
Basos: 0 %
EOS (ABSOLUTE): 0.4 10*3/uL (ref 0.0–0.4)
Eos: 7 %
Hematocrit: 46 % (ref 34.0–46.6)
Hemoglobin: 15.2 g/dL (ref 11.1–15.9)
Immature Grans (Abs): 0 10*3/uL (ref 0.0–0.1)
Immature Granulocytes: 0 %
Lymphocytes Absolute: 1.7 10*3/uL (ref 0.7–3.1)
Lymphs: 30 %
MCH: 28.3 pg (ref 26.6–33.0)
MCHC: 33 g/dL (ref 31.5–35.7)
MCV: 86 fL (ref 79–97)
Monocytes Absolute: 0.5 10*3/uL (ref 0.1–0.9)
Monocytes: 10 %
Neutrophils Absolute: 3 10*3/uL (ref 1.4–7.0)
Neutrophils: 53 %
Platelets: 152 10*3/uL (ref 150–450)
RBC: 5.38 x10E6/uL — ABNORMAL HIGH (ref 3.77–5.28)
RDW: 13.8 % (ref 11.7–15.4)
WBC: 5.7 10*3/uL (ref 3.4–10.8)

## 2020-11-08 LAB — CMP14 + ANION GAP
ALT: 14 IU/L (ref 0–32)
AST: 20 IU/L (ref 0–40)
Albumin/Globulin Ratio: 1.9 (ref 1.2–2.2)
Albumin: 4.3 g/dL (ref 3.8–4.8)
Alkaline Phosphatase: 48 IU/L (ref 44–121)
Anion Gap: 17 mmol/L (ref 10.0–18.0)
BUN/Creatinine Ratio: 19 (ref 9–23)
BUN: 17 mg/dL (ref 6–24)
Bilirubin Total: 0.3 mg/dL (ref 0.0–1.2)
CO2: 20 mmol/L (ref 20–29)
Calcium: 9 mg/dL (ref 8.7–10.2)
Chloride: 104 mmol/L (ref 96–106)
Creatinine, Ser: 0.89 mg/dL (ref 0.57–1.00)
Globulin, Total: 2.3 g/dL (ref 1.5–4.5)
Glucose: 83 mg/dL (ref 70–99)
Potassium: 4.5 mmol/L (ref 3.5–5.2)
Sodium: 141 mmol/L (ref 134–144)
Total Protein: 6.6 g/dL (ref 6.0–8.5)
eGFR: 82 mL/min/{1.73_m2} (ref >59)

## 2020-11-08 LAB — HIV ANTIBODY (ROUTINE TESTING W REFLEX): HIV Screen 4th Generation wRfx: NONREACTIVE

## 2020-11-08 LAB — HEPATITIS C ANTIBODY: Hep C Virus Ab: <0.1 s/co ratio (ref 0.0–0.9)

## 2020-11-08 NOTE — Progress Notes (Signed)
Internal Medicine Clinic Attending  I saw and evaluated the patient.  I personally confirmed the key portions of the history and exam documented by Dr. Jeanice Lim and I reviewed pertinent patient test results.  The assessment, diagnosis, and plan were formulated together and I agree with the documentation in the resident's note. Discussed GLP1 for weight loss, patient requested referral to weight management clinic.

## 2020-11-12 ENCOUNTER — Ambulatory Visit (INDEPENDENT_AMBULATORY_CARE_PROVIDER_SITE_OTHER): Payer: Medicaid Other | Admitting: Podiatry

## 2020-11-12 ENCOUNTER — Other Ambulatory Visit: Payer: Self-pay

## 2020-11-12 ENCOUNTER — Encounter: Payer: Self-pay | Admitting: Podiatry

## 2020-11-12 DIAGNOSIS — M76822 Posterior tibial tendinitis, left leg: Secondary | ICD-10-CM

## 2020-11-12 DIAGNOSIS — M722 Plantar fascial fibromatosis: Secondary | ICD-10-CM | POA: Diagnosis not present

## 2020-11-12 MED ORDER — MELOXICAM 15 MG PO TABS: 15.0000 mg | ORAL_TABLET | Freq: Every day | ORAL | 0 refills | Status: DC

## 2020-11-12 MED ORDER — DEXAMETHASONE SODIUM PHOSPHATE 120 MG/30ML IJ SOLN
4.0000 mg | Freq: Once | INTRAMUSCULAR | Status: AC
  Administered 2020-11-12: 4 mg via INTRA_ARTICULAR

## 2020-11-12 NOTE — Progress Notes (Signed)
  Subjective:  Patient ID: Sara Pratt, female    DOB: 03-May-1975,   MRN: [id number]  Chief Complaint  Patient presents with   MRI Results review    Follow up MRI results review. Pt states pain has not changed. No questions or concerns.     45 y.o. female presents for follow-up of MRI results for left foot plantar fasciitis and left medial ankle pain. Patient relates her pain has been the same and the area on her foot is still tender to touch. 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 Has been in the care of Dr. March Rummage.  . Denies any other pedal complaints. Denies n/v/f/c.   Past Medical History:  Diagnosis Date   Anxiety    Arthritis    Asthma    GERD (gastroesophageal reflux disease)    Pneumonia    PONV (postoperative nausea and vomiting)    Scoliosis     Objective:  Physical Exam: Vascular: DP/PT pulses 2/4 bilateral. CFT <3 seconds. Normal hair growth on digits. No edema.  Skin. No lacerations or abrasions bilateral feet.  Musculoskeletal: MMT 5/5 bilateral lower extremities in DF, PF, Inversion and Eversion. Deceased ROM in DF of ankle joint. Extremely tender over PT tendon insertion and medial calcaneal tubercle.  Neurological: Sensation intact to light touch.   MRI results reviewed. Show degenerative changes of the os navicular with synchondrosis and posterior tibial tendon synovitis. Thickening of the plantar fascia.   Assessment:   1. Posterior tibial tendonitis, left   2. Plantar fasciitis      Plan:  Patient was evaluated and treated and all questions answered. MRI reviewed and discussed with patient. Discussed PTTD diagnosis and treatment options with patient as well as plantar fasciitis.  Patient would like to avoid surgery, but did discuss surgical options.  Continue stretching and anti-inflammatories Prescription for meloxicam provided. CAM boot  dispensed. Patient to return to clinic in 6 to 8 weeks or sooner if symptoms fail to improve  or worsen.   Lorenda Peck, DPM

## 2020-11-28 ENCOUNTER — Encounter: Payer: Medicaid Other | Admitting: Internal Medicine

## 2020-12-17 ENCOUNTER — Ambulatory Visit: Payer: Medicaid Other | Admitting: Podiatry

## 2020-12-24 ENCOUNTER — Ambulatory Visit: Payer: Medicaid Other | Admitting: Podiatry

## 2021-04-30 ENCOUNTER — Emergency Department (HOSPITAL_COMMUNITY): Payer: Medicaid Other

## 2021-04-30 ENCOUNTER — Other Ambulatory Visit: Payer: Self-pay

## 2021-04-30 ENCOUNTER — Emergency Department (HOSPITAL_COMMUNITY)
Admission: EM | Admit: 2021-04-30 | Discharge: 2021-04-30 | Disposition: A | Discharge status: Home / Self Care | Payer: Medicaid Other | Attending: Emergency Medicine | Admitting: Emergency Medicine

## 2021-04-30 ENCOUNTER — Telehealth: Payer: Self-pay | Admitting: *Deleted

## 2021-04-30 DIAGNOSIS — E876 Hypokalemia: Secondary | ICD-10-CM | POA: Diagnosis not present

## 2021-04-30 DIAGNOSIS — J45901 Unspecified asthma with (acute) exacerbation: Secondary | ICD-10-CM | POA: Insufficient documentation

## 2021-04-30 DIAGNOSIS — D72829 Elevated white blood cell count, unspecified: Secondary | ICD-10-CM | POA: Diagnosis not present

## 2021-04-30 DIAGNOSIS — R457 State of emotional shock and stress, unspecified: Secondary | ICD-10-CM | POA: Diagnosis not present

## 2021-04-30 DIAGNOSIS — R0602 Shortness of breath: Secondary | ICD-10-CM | POA: Diagnosis not present

## 2021-04-30 DIAGNOSIS — R0689 Other abnormalities of breathing: Secondary | ICD-10-CM | POA: Diagnosis not present

## 2021-04-30 DIAGNOSIS — R062 Wheezing: Secondary | ICD-10-CM | POA: Diagnosis not present

## 2021-04-30 DIAGNOSIS — J984 Other disorders of lung: Secondary | ICD-10-CM | POA: Diagnosis not present

## 2021-04-30 DIAGNOSIS — I10 Essential (primary) hypertension: Secondary | ICD-10-CM | POA: Diagnosis not present

## 2021-04-30 LAB — CBC WITH DIFFERENTIAL/PLATELET
Abs Immature Granulocytes: 0.04 10*3/uL (ref 0.00–0.07)
Basophils Absolute: 0 10*3/uL (ref 0.0–0.1)
Basophils Relative: 0 %
Eosinophils Absolute: 0.3 10*3/uL (ref 0.0–0.5)
Eosinophils Relative: 2 %
HCT: 42.1 % (ref 36.0–46.0)
Hemoglobin: 14.5 g/dL (ref 12.0–15.0)
Immature Granulocytes: 0 %
Lymphocytes Relative: 15 %
Lymphs Abs: 1.8 10*3/uL (ref 0.7–4.0)
MCH: 28.2 pg (ref 26.0–34.0)
MCHC: 34.4 g/dL (ref 30.0–36.0)
MCV: 81.7 fL (ref 80.0–100.0)
Monocytes Absolute: 0.7 10*3/uL (ref 0.1–1.0)
Monocytes Relative: 6 %
Neutro Abs: 8.9 10*3/uL — ABNORMAL HIGH (ref 1.7–7.7)
Neutrophils Relative %: 77 %
Platelets: 181 10*3/uL (ref 150–400)
RBC: 5.15 MIL/uL — ABNORMAL HIGH (ref 3.87–5.11)
RDW: 13.3 % (ref 11.5–15.5)
WBC: 11.7 10*3/uL — ABNORMAL HIGH (ref 4.0–10.5)
nRBC: 0 % (ref 0.0–0.2)

## 2021-04-30 LAB — BASIC METABOLIC PANEL
Anion gap: 9 (ref 5–15)
BUN: 6 mg/dL (ref 6–20)
CO2: 24 mmol/L (ref 22–32)
Calcium: 9.2 mg/dL (ref 8.9–10.3)
Chloride: 105 mmol/L (ref 98–111)
Creatinine, Ser: 1.03 mg/dL — ABNORMAL HIGH (ref 0.44–1.00)
GFR, Estimated: >60 mL/min (ref >60)
Glucose, Bld: 113 mg/dL — ABNORMAL HIGH (ref 70–99)
Potassium: 2.9 mmol/L — ABNORMAL LOW (ref 3.5–5.1)
Sodium: 138 mmol/L (ref 135–145)

## 2021-04-30 LAB — BRAIN NATRIURETIC PEPTIDE: B Natriuretic Peptide: 55.3 pg/mL (ref 0.0–100.0)

## 2021-04-30 MED ORDER — AZITHROMYCIN 250 MG PO TABS: 250.0000 mg | ORAL_TABLET | Freq: Every day | ORAL | 0 refills | Status: DC

## 2021-04-30 MED ORDER — IOHEXOL 350 MG/ML SOLN
75.0000 mL | Freq: Once | INTRAVENOUS | Status: AC | PRN
  Administered 2021-04-30: 75 mL via INTRAVENOUS

## 2021-04-30 MED ORDER — METHYLPREDNISOLONE SODIUM SUCC 125 MG IJ SOLR
125.0000 mg | Freq: Once | INTRAMUSCULAR | Status: AC
  Administered 2021-04-30: 125 mg via INTRAVENOUS
  Filled 2021-04-30: qty 2

## 2021-04-30 MED ORDER — ACETAMINOPHEN 325 MG PO TABS
650.0000 mg | ORAL_TABLET | Freq: Once | ORAL | Status: AC
  Administered 2021-04-30: 650 mg via ORAL
  Filled 2021-04-30: qty 2

## 2021-04-30 MED ORDER — IPRATROPIUM-ALBUTEROL 0.5-2.5 (3) MG/3ML IN SOLN
3.0000 mL | Freq: Once | RESPIRATORY_TRACT | Status: AC
  Administered 2021-04-30: 3 mL via RESPIRATORY_TRACT
  Filled 2021-04-30: qty 3

## 2021-04-30 MED ORDER — ALBUTEROL SULFATE (2.5 MG/3ML) 0.083% IN NEBU
10.0000 mg/h | INHALATION_SOLUTION | RESPIRATORY_TRACT | Status: AC
  Administered 2021-04-30: 10 mg/h via RESPIRATORY_TRACT
  Filled 2021-04-30: qty 3

## 2021-04-30 MED ORDER — SODIUM CHLORIDE 0.9 % IV SOLN
1.0000 g | Freq: Once | INTRAVENOUS | Status: AC
  Administered 2021-04-30: 1 g via INTRAVENOUS
  Filled 2021-04-30: qty 10

## 2021-04-30 MED ORDER — POTASSIUM CHLORIDE CRYS ER 20 MEQ PO TBCR
40.0000 meq | EXTENDED_RELEASE_TABLET | Freq: Once | ORAL | Status: AC
  Administered 2021-04-30: 40 meq via ORAL
  Filled 2021-04-30: qty 2

## 2021-04-30 MED ORDER — SODIUM CHLORIDE 0.9 % IV BOLUS
1000.0000 mL | Freq: Once | INTRAVENOUS | Status: AC
  Administered 2021-04-30: 1000 mL via INTRAVENOUS

## 2021-04-30 MED ORDER — PREDNISONE 20 MG PO TABS: 20.0000 mg | ORAL_TABLET | Freq: Two times a day (BID) | ORAL | 0 refills | Status: AC

## 2021-04-30 MED ORDER — ALBUTEROL SULFATE HFA 108 (90 BASE) MCG/ACT IN AERS: 1.0000 | INHALATION_SPRAY | Freq: Four times a day (QID) | RESPIRATORY_TRACT | 0 refills | Status: DC | PRN

## 2021-04-30 NOTE — ED Provider Notes (Signed)
?[facility] ?Provider Note ? ? ?CSN: [id number] ?Arrival date & time: 04/30/21  1501 ? ?  ? ?History ? ?Chief Complaint  ?Patient presents with  ? Shortness of Breath  ? ? ?Sara Pratt is a 46 y.o. female. ? ?Patient presents with cough shortness of breath symptoms ongoing for the past 3 to 4 days.  She has a history of asthma and is concerned that it may be acting up.  Denies headache or chest pain or abdominal pain.  No reports of vomiting or diarrhea.  She states she does not have an inhaler at home. ? ? ?  ? ?Home Medications ?Prior to Admission medications   ?Medication Sig Start Date End Date Taking? Authorizing Provider  ?albuterol (VENTOLIN HFA) 108 (90 Base) MCG/ACT inhaler Inhale 1-2 puffs into the lungs every 6 (six) hours as needed for wheezing or shortness of breath. 04/30/21  Yes Kazmir Oki, Greggory Brandy, MD  ?azithromycin (ZITHROMAX) 250 MG tablet Take 1 tablet (250 mg total) by mouth daily. Take first 2 tablets together, then 1 every day until finished. 04/30/21  Yes Luna Fuse, MD  ?predniSONE (DELTASONE) 20 MG tablet Take 1 tablet (20 mg total) by mouth 2 (two) times daily with a meal for 5 days. 04/30/21 05/05/21 Yes Luna Fuse, MD  ?diclofenac Sodium (VOLTAREN) 1 % GEL Apply 2 g topically 4 (four) times daily. 11/07/20   Virl Axe, MD  ?famotidine (PEPCID) 40 MG tablet Take by mouth. 08/17/19   [provider]  ?meloxicam (MOBIC) 15 MG tablet Take 1 tablet (15 mg total) by mouth daily. 11/12/20   Lorenda Peck, MD  ?methylPREDNISolone (MEDROL DOSEPAK) 4 MG TBPK tablet 6 Day Taper Pack. Take as Directed. 09/13/20   Evelina Bucy, DPM  ?ondansetron (ZOFRAN-ODT) 4 MG disintegrating tablet Take 4 mg by mouth every 8 (eight) hours as needed for nausea or vomiting.    [provider]  ?PRESCRIPTION MEDICATION Taking something for acid reflux, unsure of name    [provider]  ?   ? ?Allergies    ?Nsaids   ? ?Review of Systems    ?Review of Systems  ?Constitutional:  Negative for fever.  ?HENT:  Negative for ear pain.   ?Eyes:  Negative for pain.  ?Respiratory:  Positive for cough and shortness of breath.   ?Cardiovascular:  Negative for chest pain.  ?Gastrointestinal:  Negative for abdominal pain.  ?Genitourinary:  Negative for flank pain.  ?Musculoskeletal:  Negative for back pain.  ?Skin:  Negative for rash.  ?Neurological:  Negative for headaches.  ? ?Physical Exam ?Updated Vital Signs ?BP 137/77 (BP Location: Right Arm)   Pulse 90   Temp 98.3 ?F (36.8 ?C) (Oral)   Resp 18   Ht '5\' 4"'$  (1.626 m)   Wt 88.9 kg   SpO2 95%   BMI 33.64 kg/m?  ?Physical Exam ?Constitutional:   ?   General: She is not in acute distress. ?   Appearance: Normal appearance.  ?HENT:  ?   Head: Normocephalic.  ?   Nose: Nose normal.  ?Eyes:  ?   Extraocular Movements: Extraocular movements intact.  ?Cardiovascular:  ?   Rate and Rhythm: Normal rate.  ?Pulmonary:  ?   Effort: Tachypnea and accessory muscle usage present.  ?   Breath sounds: Wheezing and rhonchi present.  ?Musculoskeletal:     ?   General: Normal range of motion.  ?   Cervical back: Normal range of motion.  ?  Neurological:  ?   General: No focal deficit present.  ?   Mental Status: She is alert. Mental status is at baseline.  ? ? ?ED Results / Procedures / Treatments   ?Labs ?(all labs ordered are listed, but only abnormal results are displayed) ?Labs Reviewed  ?CBC WITH DIFFERENTIAL/PLATELET - Abnormal; Notable for the following components:  ?    Result Value  ? WBC 11.7 (*)   ? RBC 5.15 (*)   ? Neutro Abs 8.9 (*)   ? All other components within normal limits  ?BASIC METABOLIC PANEL - Abnormal; Notable for the following components:  ? Potassium 2.9 (*)   ? Glucose, Bld 113 (*)   ? Creatinine, Ser 1.03 (*)   ? All other components within normal limits  ?BRAIN NATRIURETIC PEPTIDE  ? ? ?EKG ?None ? ?Radiology ?CT Angio Chest PE W and/or Wo Contrast ? ?Result Date: 04/30/2021 ?CLINICAL DATA:   Pulmonary embolism suspected. Abnormal D-dimer. Shortness of breath. EXAM: CT ANGIOGRAPHY CHEST WITH CONTRAST TECHNIQUE: Multidetector CT imaging of the chest was performed using the standard protocol during bolus administration of intravenous contrast. Multiplanar CT image reconstructions and MIPs were obtained to evaluate the vascular anatomy. RADIATION DOSE REDUCTION: This exam was performed according to the departmental dose-optimization program which includes automated exposure control, adjustment of the mA and/or kV according to patient size and/or use of iterative reconstruction technique. CONTRAST:  86m OMNIPAQUE IOHEXOL 350 MG/ML SOLN COMPARISON:  Chest radiography same day FINDINGS: Cardiovascular: Heart size is normal. No pericardial fluid. No coronary artery calcification or aortic atherosclerotic calcification. No pulmonary emboli. Mediastinum/Nodes: No mediastinal or hilar mass or lymphadenopathy. Lungs/Pleura: No pleural effusion. Volume loss in the right middle lobe which could be simple atelectasis or atelectatic pneumonia. Minimal volume loss in the lingula. The remainder of the lungs are clear. Few small scars in the right upper lobe. Upper Abdomen: Normal Musculoskeletal: Minimal thoracic degenerative changes. Review of the MIP images confirms the above findings. IMPRESSION: No pulmonary emboli. Volume loss in the right middle lobe which could be simple atelectasis or atelectatic pneumonia. Minimal volume loss in the lingula. Electronically Signed   By: MNelson ChimesM.D.   On: 04/30/2021 18:41  ? ?DG Chest Port 1 View ? ?Result Date: 04/30/2021 ?CLINICAL DATA:  Short of breath EXAM: PORTABLE CHEST 1 VIEW COMPARISON:  05/30/2017 FINDINGS: Single frontal view of the chest demonstrates an unremarkable cardiac silhouette. There is bilateral bronchovascular prominence, greatest at the lung bases, most consistent with reactive airway disease. Lungs are normally inflated. No focal consolidation,  effusion, or pneumothorax. IMPRESSION: 1. Bilateral bronchovascular prominence most consistent with reactive airway disease. No lobar pneumonia. Electronically Signed   By: MRanda NgoM.D.   On: 04/30/2021 17:09   ? ?Procedures ?Procedures  ? ? ?Medications Ordered in ED ?Medications  ?albuterol (PROVENTIL) (2.5 MG/3ML) 0.083% nebulizer solution (10 mg/hr Nebulization New Bag/Given 04/30/21 1905)  ?cefTRIAXone (ROCEPHIN) 1 g in sodium chloride 0.9 % 100 mL IVPB (has no administration in time range)  ?ipratropium-albuterol (DUONEB) 0.5-2.5 (3) MG/3ML nebulizer solution 3 mL (3 mLs Nebulization Given 04/30/21 1610)  ?methylPREDNISolone sodium succinate (SOLU-MEDROL) 125 mg/2 mL injection 125 mg (125 mg Intravenous Given 04/30/21 1607)  ?acetaminophen (TYLENOL) tablet 650 mg (650 mg Oral Given 04/30/21 1606)  ?sodium chloride 0.9 % bolus 1,000 mL (0 mLs Intravenous Stopped 04/30/21 1733)  ?potassium chloride SA (KLOR-CON M) CR tablet 40 mEq (40 mEq Oral Given 04/30/21 1735)  ?iohexol (OMNIPAQUE) 350 MG/ML injection 75 mL (  75 mLs Intravenous Contrast Given 04/30/21 1835)  ?potassium chloride SA (KLOR-CON M) CR tablet 40 mEq (40 mEq Oral Given 04/30/21 1932)  ?acetaminophen (TYLENOL) tablet 650 mg (650 mg Oral Given 04/30/21 1932)  ? ? ?ED Course/ Medical Decision Making/ A&P ?  ?                        ?Medical Decision Making ?Amount and/or Complexity of Data Reviewed ?Labs: ordered. ?Radiology: ordered. ? ?Risk ?OTC drugs. ?Prescription drug management. ? ? ?Chart review shows office visit with podiatry a year ago and with internal medicine phone call today. ? ?History obtained from the patient. ? ?Cardiac monitoring shows sinus rhythm tachycardic. ? ?Work-up included labs.  White count mildly elevated chemistry remarkable other than potassium being slightly low and repleted here in the ER. ? ?Chest x-ray equivocal, CT angio pursued with no evidence of pulmonary embolism but questionable pneumonia. ? ?Patient given a gram  of Rocephin here will be for discharge home with azithromycin and asthma medications.  Advised outpatient follow-up with her doctor again this week.  Advised immediate return for worsening symptoms pain difficulty breathing or

## 2021-04-30 NOTE — Discharge Instructions (Signed)
Call your primary care doctor or specialist as discussed in the next 2-3 days.   Return immediately back to the ER if:  Your symptoms worsen within the next 12-24 hours. You develop new symptoms such as new fevers, persistent vomiting, new pain, shortness of breath, or new weakness or numbness, or if you have any other concerns.  

## 2021-04-30 NOTE — ED Triage Notes (Addendum)
Pt BIB EMS due to [facility]. Pt called PCP for inhaler and they called 911. Pt is axox4. VSS. Pt is 97% on RA. Pt has hx of asthma. Pt recived '5mg'$  of albuterol via ems  ?

## 2021-04-30 NOTE — Telephone Encounter (Signed)
Agree and glad EMS arrived when they did. Thank you ?

## 2021-04-30 NOTE — Telephone Encounter (Addendum)
Patient called into FO stating she needed help, "I can't breathe." Transferred to Triage RN. Patient sounding in obvious distress. Very difficult to understand as she is unable to speak 2 words w/o gasping for breath. Stated she was unable to locate her inhaler. Also, c/o body aches, back pain. States she went to [facility] yesterday and was unable to be seen as she didn't have her MCD card. Co-worker called 911 [facility]) as this RN continued to speak with patient and performed pursed lip breathing with her.EMS arrived ~1420 and call ended. ? ?EMS sent to patient's current location: ?[address] ?[place], [place] [address] ?

## 2021-07-30 ENCOUNTER — Ambulatory Visit (INDEPENDENT_AMBULATORY_CARE_PROVIDER_SITE_OTHER): Payer: Self-pay | Admitting: Bariatrics

## 2021-08-13 ENCOUNTER — Ambulatory Visit (INDEPENDENT_AMBULATORY_CARE_PROVIDER_SITE_OTHER): Payer: Medicaid Other | Admitting: Bariatrics

## 2021-08-20 ENCOUNTER — Ambulatory Visit: Payer: Medicaid Other

## 2021-08-21 ENCOUNTER — Ambulatory Visit (INDEPENDENT_AMBULATORY_CARE_PROVIDER_SITE_OTHER): Payer: Medicaid Other | Admitting: *Deleted

## 2021-08-21 ENCOUNTER — Other Ambulatory Visit: Payer: Self-pay

## 2021-08-21 ENCOUNTER — Other Ambulatory Visit (HOSPITAL_COMMUNITY)
Admission: RE | Admit: 2021-08-21 | Discharge: 2021-08-21 | Disposition: A | Discharge status: Home / Self Care | Payer: Medicaid Other | Source: Ambulatory Visit | Attending: Obstetrics & Gynecology | Admitting: Obstetrics & Gynecology

## 2021-08-21 VITALS — BP 140/75 | HR 71 | Ht 64.0 in | Wt 209.5 lb

## 2021-08-21 DIAGNOSIS — N898 Other specified noninflammatory disorders of vagina: Secondary | ICD-10-CM | POA: Diagnosis present

## 2021-08-21 DIAGNOSIS — A5901 Trichomonal vulvovaginitis: Secondary | ICD-10-CM

## 2021-08-21 HISTORY — DX: Trichomonal vulvovaginitis: A59.01

## 2021-08-21 MED ORDER — FLUCONAZOLE 150 MG PO TABS: 150.0000 mg | ORAL_TABLET | Freq: Once | ORAL | 0 refills | Status: AC

## 2021-08-21 NOTE — Progress Notes (Signed)
States was on antibiotics and always gets yeast infection. States having thick white vaginal discharge and vaginal itching. Will go ahead and send in Diflucan since she is uncomfortable , will do self swab and if any other + tests, will treat accordingly. Advised patient she is due for annual exam and to schedule at checkout. She voices understanding. Staci Acosta

## 2021-08-22 LAB — CERVICOVAGINAL ANCILLARY ONLY
Bacterial Vaginitis (gardnerella): POSITIVE — AB
Candida Glabrata: NEGATIVE
Candida Vaginitis: POSITIVE — AB
Chlamydia: NEGATIVE
Comment: NEGATIVE
Comment: NEGATIVE
Comment: NEGATIVE
Comment: NEGATIVE
Comment: NEGATIVE
Comment: NORMAL
Neisseria Gonorrhea: NEGATIVE
Trichomonas: POSITIVE — AB

## 2021-08-23 ENCOUNTER — Encounter: Payer: Self-pay | Admitting: Obstetrics & Gynecology

## 2021-08-23 ENCOUNTER — Other Ambulatory Visit: Payer: Self-pay | Admitting: Obstetrics & Gynecology

## 2021-08-23 DIAGNOSIS — A5901 Trichomonal vulvovaginitis: Secondary | ICD-10-CM

## 2021-08-23 DIAGNOSIS — Z113 Encounter for screening for infections with a predominantly sexual mode of transmission: Secondary | ICD-10-CM

## 2021-08-23 DIAGNOSIS — B9689 Other specified bacterial agents as the cause of diseases classified elsewhere: Secondary | ICD-10-CM

## 2021-08-23 DIAGNOSIS — B3731 Acute candidiasis of vulva and vagina: Secondary | ICD-10-CM

## 2021-08-23 MED ORDER — FLUCONAZOLE 150 MG PO TABS: 150.0000 mg | ORAL_TABLET | ORAL | 3 refills | Status: DC

## 2021-08-23 MED ORDER — METRONIDAZOLE 500 MG PO TABS: 500.0000 mg | ORAL_TABLET | Freq: Two times a day (BID) | ORAL | 1 refills | Status: AC

## 2021-08-23 NOTE — Progress Notes (Signed)
Patient has trichomonal vaginitis, also has bacterial and yeast vaginitis.  Recommend testing for other STIs (future order placed), also needs to let partner(s) know so the partner(s) can get testing and treatment. Patient and sex partner(s) should abstain from unprotected sexual activity for seven days after everyone receives appropriate treatment.  Metronidazole was prescribed for patient for the trichomonal and bacterial vaginitis, Diflucan for yeast.  Patient will need to return in about 4 weeks after treatment for repeat test of cure.  Please call to inform patient of results and recommendations, and advise to pick up prescription and take as directed.  Please advise patient to practice safe sex at all times.   Sara Schneiders, MD

## 2021-08-25 ENCOUNTER — Telehealth: Payer: Self-pay

## 2021-08-25 NOTE — Telephone Encounter (Signed)
-----   Message from Osborne Oman, MD sent at 08/23/2021  4:17 PM EDT ----- Patient has trichomonal vaginitis, also has bacterial and yeast vaginitis.  Recommend testing for other STIs (future order placed), also needs to let partner(s) know so the partner(s) can get testing and treatment. Patient and sex partner(s) should abstain from unprotected sexual activity for seven days after everyone receives appropriate treatment.  Metronidazole was prescribed for patient for the trichomonal and bacterial vaginitis, Diflucan for yeast.  Patient will need to return in about 4 weeks after treatment for repeat test of cure.  Please call to inform patient of results and recommendations, and advise to pick up prescription and take as directed.  Please advise patient to practice safe sex at all times.   Verita Schneiders, MD

## 2021-08-25 NOTE — Telephone Encounter (Signed)
Called Pt to go over positive test results of Trich, BV & Yeast, Pt states did see thru My Chart, going to pick up Rx today. Pt verbalized understanding.

## 2021-09-10 ENCOUNTER — Encounter (INDEPENDENT_AMBULATORY_CARE_PROVIDER_SITE_OTHER): Payer: Self-pay

## 2021-09-11 ENCOUNTER — Ambulatory Visit: Payer: Medicaid Other

## 2021-09-11 ENCOUNTER — Telehealth: Payer: Self-pay | Admitting: Family Medicine

## 2021-09-11 NOTE — Telephone Encounter (Signed)
Called patient and she states she is having difficulty getting her fluconazole because it is on backorder. Told patient I would call the pharmacy and figure out a solution. Patient verbalized understanding. Called [facility] who states they just got a shipment in. They will get the prescription ready now.

## 2021-09-11 NOTE — Telephone Encounter (Signed)
Patient said she is still feeling a little un easy after taking her medication.

## 2021-09-25 ENCOUNTER — Other Ambulatory Visit (HOSPITAL_COMMUNITY)
Admission: RE | Admit: 2021-09-25 | Discharge: 2021-09-25 | Disposition: A | Discharge status: Home / Self Care | Payer: Medicaid Other | Source: Ambulatory Visit | Attending: Family Medicine | Admitting: Family Medicine

## 2021-09-25 ENCOUNTER — Ambulatory Visit: Payer: Medicaid Other | Admitting: *Deleted

## 2021-09-25 ENCOUNTER — Other Ambulatory Visit: Payer: Self-pay

## 2021-09-25 VITALS — BP 114/69 | HR 80 | Ht 60.0 in | Wt 201.3 lb

## 2021-09-25 DIAGNOSIS — Z202 Contact with and (suspected) exposure to infections with a predominantly sexual mode of transmission: Secondary | ICD-10-CM | POA: Diagnosis present

## 2021-09-25 DIAGNOSIS — N760 Acute vaginitis: Secondary | ICD-10-CM | POA: Insufficient documentation

## 2021-09-25 DIAGNOSIS — Z113 Encounter for screening for infections with a predominantly sexual mode of transmission: Secondary | ICD-10-CM | POA: Diagnosis not present

## 2021-09-25 NOTE — Progress Notes (Signed)
Here for nurse visit . States wants to do self swab to make sure all infections are clear- she did complete treatment for BV, trich and yeast and just wants to be sure all clear. We discussed Dr. Harolyn Rutherford had recommended other STD testing labs- bloodwork and I asked if she desired to do today or at her annual exam she has scheduled. She states she prefers to do today. Lab notified and labs drawn. Self swab obtained and explained if anything + she will be notified. She voices understanding. Staci Acosta

## 2021-09-26 LAB — CERVICOVAGINAL ANCILLARY ONLY
Bacterial Vaginitis (gardnerella): POSITIVE — AB
Candida Glabrata: NEGATIVE
Candida Vaginitis: NEGATIVE
Chlamydia: NEGATIVE
Comment: NEGATIVE
Comment: NEGATIVE
Comment: NEGATIVE
Comment: NEGATIVE
Comment: NEGATIVE
Comment: NORMAL
Neisseria Gonorrhea: NEGATIVE
Trichomonas: NEGATIVE

## 2021-09-26 LAB — RPR+HBSAG+HCVAB+...
HIV Screen 4th Generation wRfx: NONREACTIVE
Hep C Virus Ab: NONREACTIVE
Hepatitis B Surface Ag: NEGATIVE
RPR Ser Ql: NONREACTIVE

## 2021-09-27 MED ORDER — METRONIDAZOLE 500 MG PO TABS: 500.0000 mg | ORAL_TABLET | Freq: Two times a day (BID) | ORAL | 0 refills | Status: DC

## 2021-09-27 NOTE — Addendum Note (Signed)
Addended by: Donnamae Jude on: 09/27/2021 01:04 AM   Modules accepted: Orders

## 2021-11-04 ENCOUNTER — Encounter: Payer: Self-pay | Admitting: Advanced Practice Midwife

## 2021-11-04 ENCOUNTER — Other Ambulatory Visit: Payer: Self-pay

## 2021-11-04 ENCOUNTER — Ambulatory Visit (INDEPENDENT_AMBULATORY_CARE_PROVIDER_SITE_OTHER): Payer: Medicaid Other | Admitting: Advanced Practice Midwife

## 2021-11-04 ENCOUNTER — Other Ambulatory Visit (HOSPITAL_COMMUNITY)
Admission: RE | Admit: 2021-11-04 | Discharge: 2021-11-04 | Disposition: A | Discharge status: Home / Self Care | Payer: Medicaid Other | Source: Ambulatory Visit | Attending: Advanced Practice Midwife | Admitting: Advanced Practice Midwife

## 2021-11-04 VITALS — BP 124/82 | HR 75 | Wt 199.6 lb

## 2021-11-04 DIAGNOSIS — Z1231 Encounter for screening mammogram for malignant neoplasm of breast: Secondary | ICD-10-CM | POA: Diagnosis not present

## 2021-11-04 DIAGNOSIS — Z0000 Encounter for general adult medical examination without abnormal findings: Secondary | ICD-10-CM | POA: Diagnosis not present

## 2021-11-04 DIAGNOSIS — Z9889 Other specified postprocedural states: Secondary | ICD-10-CM | POA: Diagnosis not present

## 2021-11-04 DIAGNOSIS — N898 Other specified noninflammatory disorders of vagina: Secondary | ICD-10-CM

## 2021-11-04 DIAGNOSIS — Z01419 Encounter for gynecological examination (general) (routine) without abnormal findings: Secondary | ICD-10-CM

## 2021-11-04 DIAGNOSIS — N760 Acute vaginitis: Secondary | ICD-10-CM

## 2021-11-04 DIAGNOSIS — B9689 Other specified bacterial agents as the cause of diseases classified elsewhere: Secondary | ICD-10-CM

## 2021-11-04 NOTE — Progress Notes (Signed)
GYNECOLOGY ANNUAL PREVENTATIVE CARE ENCOUNTER NOTE  History:     Sara Pratt is a 46 y.o. G61P0 female here for a routine annual gynecologic exam.  Current complaints: concern for BV. Tested positive twice. Sx resolved but want   s to be tested again. Has used Boric acid with good result.  Denies abnormal vaginal bleeding, discharge, pelvic pain, problems with intercourse or other gynecologic concerns.    Gynecologic History No LMP recorded. Patient has had a hysterectomy. Contraception:  Hysterectomy   2013 C/ Hyst. Unsure if cervix was removed.  Last Pap: Once after Hyst. Nml per pt. Denies any Hx abnml Paps.  Last mammogram: 2021. Results were: abnormal, Benign mass. Was due for Mammogram in 1 year, but didn't get a phone call.    Upstream - 11/04/21 0953       Pregnancy Intention Screening   Does the patient want to become pregnant in the next year? No    Does the patient's partner want to become pregnant in the next year? No    Would the patient like to discuss contraceptive options today? No      Contraception Wrap Up   Current Method --   Hysterectomy   Contraception Counseling Provided No    How was the end contraceptive method provided? N/A            The pregnancy intention screening data noted above was reviewed. Potential methods of contraception were discussed. The patient elected to proceed with No data recorded   Obstetric History OB History  Gravida Para Term Preterm AB Living  6         5  SAB IAB Ectopic Multiple Live Births               # Outcome Date GA Lbr Len/2nd Weight Sex Delivery Anes PTL Lv  6 Gravida           5 Gravida           4 Gravida           3 Gravida           2 Gravida           1 [place]             Past Medical History:  Diagnosis Date   Anxiety    Arthritis    Asthma    GERD (gastroesophageal reflux disease)    Peritoneal adhesions 11/27/2015   Pneumonia    PONV (postoperative nausea and vomiting)    Scoliosis     Trichomonal vaginitis 08/21/2021   Urinary incontinence 03/25/2016    Past Surgical History:  Procedure Laterality Date   ABDOMINAL HYSTERECTOMY  2013   C-Hyst for Uterine Rupture. Unsure if supracervical   ABDOMINOPLASTY     APPENDECTOMY     CESAREAN SECTION     CHOLECYSTECTOMY     HERNIA REPAIR     LAPAROSCOPY  11/12/2015   Procedure: LAPAROSCOPY DIAGNOSTIC;  Surgeon: Mora Bellman, MD;  Location: [facility];  Service: Gynecology;;   LYSIS OF ADHESION  11/12/2015   Procedure: LYSIS OF ADHESION;  Surgeon: Mora Bellman, MD;  Location: [facility];  Service: Gynecology;;   LYSIS OF ADHESION Left 03/03/2016   Procedure: LYSIS OF ADHESION;  Surgeon: Everitt Amber, MD;  Location: [facility];  Service: Gynecology;  Laterality: Left;   ROBOTIC ASSISTED BILATERAL SALPINGO OOPHERECTOMY Left 03/03/2016   Procedure: XI ROBOTIC ASSISTED LEFT  SALPINGO OOPHORECTOMY;  Surgeon: Everitt Amber,  MD;  Location: [facility];  Service: Gynecology;  Laterality: Left;    Current Outpatient Medications on File Prior to Visit  Medication Sig Dispense Refill   albuterol (VENTOLIN HFA) 108 (90 Base) MCG/ACT inhaler Inhale 1-2 puffs into the lungs every 6 (six) hours as needed for wheezing or shortness of breath. 1 each 0   diclofenac Sodium (VOLTAREN) 1 % GEL Apply 2 g topically 4 (four) times daily. 2 g 0   famotidine (PEPCID) 40 MG tablet Take by mouth.     azithromycin (ZITHROMAX) 250 MG tablet Take 1 tablet (250 mg total) by mouth daily. Take first 2 tablets together, then 1 every day until finished. (Patient not taking: Reported on 11/04/2021) 6 tablet 0   CREATINE PO Take 3 tablets by mouth as needed. Up to 6 per day     fluconazole (DIFLUCAN) 150 MG tablet Take 1 tablet (150 mg total) by mouth every 3 (three) days. For three doses (Patient not taking: Reported on 11/04/2021) 3 tablet 3   meloxicam (MOBIC) 15 MG tablet Take 1 tablet (15 mg total) by mouth daily. (Patient not taking: Reported on 11/04/2021) 30 tablet 0    methylPREDNISolone (MEDROL DOSEPAK) 4 MG TBPK tablet 6 Day Taper Pack. Take as Directed. (Patient not taking: Reported on 11/04/2021) 21 tablet 0   metroNIDAZOLE (FLAGYL) 500 MG tablet Take 1 tablet (500 mg total) by mouth 2 (two) times daily. (Patient not taking: Reported on 11/04/2021) 14 tablet 0   ondansetron (ZOFRAN-ODT) 4 MG disintegrating tablet Take 4 mg by mouth every 8 (eight) hours as needed for nausea or vomiting. (Patient not taking: Reported on 11/04/2021)     PRESCRIPTION MEDICATION Taking something for acid reflux, unsure of name (Patient not taking: Reported on 11/04/2021)     No current facility-administered medications on file prior to visit.    Allergies  Allergen Reactions   Nsaids Other (See Comments)    Has hernia, was told not to take    Social History:  reports that she quit smoking about 16 months ago. Her smoking use included cigarettes. She has a 16.00 pack-year smoking history. She has never used smokeless tobacco. She reports current alcohol use. She reports that she does not use drugs.  Family History  Problem Relation Age of Onset   Breast cancer Mother     The following portions of the patient's history were reviewed and updated as appropriate: allergies, current medications, past family history, past medical history, past social history, past surgical history and problem list.  Review of Systems Review of Systems  Gastrointestinal:  Negative for abdominal pain.  Genitourinary:  Negative for vaginal discharge and vaginal pain.      Physical Exam:  BP 124/82   Pulse 75   Wt 199 lb 9.6 oz (90.5 kg)   BMI 38.98 kg/m  CONSTITUTIONAL: Well-developed, well-nourished female in no acute distress.  HENT:  Normocephalic, atraumatic. EYES: Conjunctivae normal. No scleral icterus.  SKIN: Skin is warm and dry. No rash noted. Not diaphoretic. No erythema. No pallor. MUSCULOSKELETAL: Normal range of motion. No tenderness.  No cyanosis, clubbing, or edema.    NEUROLOGIC: Alert and oriented to person, place, and time. Normal muscle tone coordination.  PSYCHIATRIC: Normal mood and affect. Normal behavior. Normal judgment and thought content. CARDIOVASCULAR: Normal heart rate noted RESPIRATORY: Clear to auscultation bilaterally. BREASTS: Declined exam. Symmetric in size. No masses, skin changes, nipple drainage, or lymphadenopathy. ABDOMEN: Soft, normal bowel sounds, no distention noted.  No tenderness, rebound or  guarding.  PELVIC: Normal appearing external genitalia; normal appearing vaginal mucosa and cervix.  Moderate amount in thin, white vaginal discharge noted. Aptima collected. Pap smear not obtained.  Normal uterine size, no other palpable masses, no uterine or adnexal tenderness.   Assessment and Plan:    1. History of breast lump/mass excision  - MM Digital Diagnostic Bilat; Future  2. Delivery by cesarean hysterectomy   3. Vaginal discharge  - Cervicovaginal ancillary only( [place])  4. Breast cancer screening by mammogram  - MM Digital Diagnostic Bilat; Future  5. Encounter for annual routine gynecological examination  - MM Digital Diagnostic Bilat; Future   Will follow up results of Aptima and manage accordingly. Will consult with attending further RE: future Paps.  Mammogram scheduled Routine preventative health maintenance measures emphasized. Please refer to After Visit Summary for other counseling recommendations.      Manya Silvas, [place] for [employer], [place]

## 2021-11-05 ENCOUNTER — Telehealth: Payer: Self-pay | Admitting: Family Medicine

## 2021-11-05 LAB — CERVICOVAGINAL ANCILLARY ONLY
Bacterial Vaginitis (gardnerella): POSITIVE — AB
Comment: NEGATIVE

## 2021-11-05 NOTE — Telephone Encounter (Signed)
Patient was here yesterday and she said she was told she  have BV, patient want to know why she keep getting them. Patient is really nervous.

## 2021-11-11 ENCOUNTER — Encounter: Payer: Self-pay | Admitting: *Deleted

## 2021-11-11 NOTE — Telephone Encounter (Signed)
I called Stephanie and reached her voicemail. I left a message I was calling her back to discuss her question and that I am sending her some information via MyChart and for her to review and reply if questions. Staci Acosta

## 2021-11-12 MED ORDER — METRONIDAZOLE 0.75 % VA GEL: 1.0000 | Freq: Every day | VAGINAL | 5 refills | Status: DC

## 2021-11-12 NOTE — Addendum Note (Signed)
Addended by: Manya Silvas on: 11/12/2021 05:40 PM   Modules accepted: Orders

## 2021-11-26 ENCOUNTER — Ambulatory Visit: Payer: Medicaid Other

## 2021-12-16 ENCOUNTER — Ambulatory Visit
Admission: RE | Admit: 2021-12-16 | Discharge: 2021-12-16 | Disposition: A | Discharge status: Home / Self Care | Payer: Medicaid Other | Source: Ambulatory Visit | Attending: Internal Medicine | Admitting: Internal Medicine

## 2021-12-16 DIAGNOSIS — Z1231 Encounter for screening mammogram for malignant neoplasm of breast: Secondary | ICD-10-CM | POA: Diagnosis not present

## 2022-09-10 ENCOUNTER — Encounter (HOSPITAL_COMMUNITY): Payer: Self-pay

## 2022-09-10 ENCOUNTER — Emergency Department (HOSPITAL_COMMUNITY)
Admission: EM | Admit: 2022-09-10 | Discharge: 2022-09-10 | Disposition: A | Discharge status: Home / Self Care | Payer: Medicaid Other | Attending: Emergency Medicine | Admitting: Emergency Medicine

## 2022-09-10 DIAGNOSIS — U071 COVID-19: Secondary | ICD-10-CM | POA: Diagnosis not present

## 2022-09-10 DIAGNOSIS — J45909 Unspecified asthma, uncomplicated: Secondary | ICD-10-CM | POA: Insufficient documentation

## 2022-09-10 DIAGNOSIS — M791 Myalgia, unspecified site: Secondary | ICD-10-CM | POA: Diagnosis present

## 2022-09-10 LAB — RESP PANEL BY RT-PCR (RSV, FLU A&B, COVID)  RVPGX2
Influenza A by PCR: NEGATIVE
Influenza B by PCR: NEGATIVE
Resp Syncytial Virus by PCR: NEGATIVE
SARS Coronavirus 2 by RT PCR: POSITIVE — AB

## 2022-09-10 MED ORDER — ONDANSETRON 4 MG PO TBDP: 4.0000 mg | ORAL_TABLET | Freq: Three times a day (TID) | ORAL | 0 refills | Status: DC | PRN

## 2022-09-10 MED ORDER — KETOROLAC TROMETHAMINE 60 MG/2ML IM SOLN
60.0000 mg | Freq: Once | INTRAMUSCULAR | Status: AC
  Administered 2022-09-10: 60 mg via INTRAMUSCULAR
  Filled 2022-09-10: qty 2

## 2022-09-10 MED ORDER — ONDANSETRON 4 MG PO TBDP
4.0000 mg | ORAL_TABLET | Freq: Once | ORAL | Status: AC
  Administered 2022-09-10: 4 mg via ORAL
  Filled 2022-09-10: qty 1

## 2022-09-10 NOTE — ED Triage Notes (Signed)
For 2 days, pt has had chills, sweats, general weakness, body pains that were in abd/pelvis/knees/joints/lower back. Pt also endorses nasal and head congestion. She generally feels unwell, she has been medicating with tylenol and at home. Had a positive COVID test at home

## 2022-09-10 NOTE — ED Provider Notes (Signed)
[place] EMERGENCY DEPARTMENT AT [facility] Provider Note   CSN: [id number] Arrival date & time: 09/10/22  0031     History  Chief Complaint  Patient presents with   Generalized Body Aches    Sara Pratt is a 47 y.o. female.  The history is provided by the patient.  Sara Pratt is a 47 y.o. female who presents to the Emergency Department complaining of bodyaches.  She presents to the emergency department for evaluation of illness that began on Sunday.  She initially had bodyaches, generalized weakness, chills.  Today she developed runny nose, chest congestion, cough.  She has a history of asthma, no additional medical problems.  No documented fevers at home.  She has been taking acetaminophen for her symptoms.  Has nausea.  No vomiting.     Home Medications Prior to Admission medications   Medication Sig Start Date End Date Taking? Authorizing Provider  ondansetron (ZOFRAN-ODT) 4 MG disintegrating tablet Take 1 tablet (4 mg total) by mouth every 8 (eight) hours as needed for nausea or vomiting. 09/10/22  Yes Tilden Fossa, MD  albuterol (VENTOLIN HFA) 108 (90 Base) MCG/ACT inhaler Inhale 1-2 puffs into the lungs every 6 (six) hours as needed for wheezing or shortness of breath. 04/30/21   Cheryll Cockayne, MD  azithromycin (ZITHROMAX) 250 MG tablet Take 1 tablet (250 mg total) by mouth daily. Take first 2 tablets together, then 1 every day until finished. Patient not taking: Reported on 11/04/2021 04/30/21   Cheryll Cockayne, MD  CREATINE PO Take 3 tablets by mouth as needed. Up to 6 per day    [provider]  diclofenac Sodium (VOLTAREN) 1 % GEL Apply 2 g topically 4 (four) times daily. 11/07/20   Merrilyn Puma, MD  famotidine (PEPCID) 40 MG tablet Take by mouth. 08/17/19   [provider]  fluconazole (DIFLUCAN) 150 MG tablet Take 1 tablet (150 mg total) by mouth every 3 (three) days. For three doses Patient not taking: Reported on 11/04/2021 08/23/21    Tereso Newcomer, MD  meloxicam (MOBIC) 15 MG tablet Take 1 tablet (15 mg total) by mouth daily. Patient not taking: Reported on 11/04/2021 11/12/20   Louann Sjogren, DPM  methylPREDNISolone (MEDROL DOSEPAK) 4 MG TBPK tablet 6 Day Taper Pack. Take as Directed. Patient not taking: Reported on 11/04/2021 09/13/20   Park Liter, DPM  metroNIDAZOLE (METROGEL) 0.75 % vaginal gel Place 1 Applicatorful vaginally at bedtime. Apply one applicatorful to vagina at bedtime for 10 days, then twice a week for 6 months. 11/12/21   Katrinka Blazing, [place], CNM  PRESCRIPTION MEDICATION Taking something for acid reflux, unsure of name Patient not taking: Reported on 11/04/2021    [provider]      Allergies    Nsaids    Review of Systems   Review of Systems  All other systems reviewed and are negative.   Physical Exam Updated Vital Signs BP (!) 146/97 (BP Location: Right Arm)   Pulse 93   Temp 98.7 F (37.1 C) (Oral)   Resp 18   SpO2 99%  Physical Exam Vitals and nursing note reviewed.  Constitutional:      Appearance: She is well-developed.  HENT:     Head: Normocephalic and atraumatic.  Cardiovascular:     Rate and Rhythm: Normal rate and regular rhythm.  Pulmonary:     Effort: Pulmonary effort is normal. No respiratory distress.     Breath sounds: Normal breath sounds.  Musculoskeletal:        General: No tenderness.  Skin:    General: Skin is warm and dry.  Neurological:     Mental Status: She is alert and oriented to person, place, and time.  Psychiatric:        Behavior: Behavior normal.     ED Results / Procedures / Treatments   Labs (all labs ordered are listed, but only abnormal results are displayed) Labs Reviewed  RESP PANEL BY RT-PCR (RSV, FLU A&B, COVID)  RVPGX2 - Abnormal; Notable for the following components:      Result Value   SARS Coronavirus 2 by RT PCR POSITIVE (*)    All other components within normal limits    EKG None  Radiology No results  found.  Procedures Procedures    Medications Ordered in ED Medications  ketorolac (TORADOL) injection 60 mg (has no administration in time range)  ondansetron (ZOFRAN-ODT) disintegrating tablet 4 mg (has no administration in time range)    ED Course/ Medical Decision Making/ A&P                                 Medical Decision Making Risk Prescription drug management.   Patient with history of asthma here for evaluation of bodyaches, runny nose, cough.  She is nontoxic-appearing on evaluation and well-hydrated.  No respiratory distress.  No clinical evidence of pneumonia or asthma exacerbation at this time.  She is positive for COVID-19.  Discussed home care for symptoms with COVID-19.  She may try OTC analgesics.  Will prescribe ondansetron that she may use as needed nausea.  She is out of the window to benefit from Paxlovid therapy.  Discussed outpatient follow-up and return precautions.        Final Clinical Impression(s) / ED Diagnoses Final diagnoses:  COVID-19 virus infection    Rx / DC Orders ED Discharge Orders          Ordered    ondansetron (ZOFRAN-ODT) 4 MG disintegrating tablet  Every 8 hours PRN        09/10/22 0202              Tilden Fossa, MD 09/10/22 [phone]

## 2023-02-11 ENCOUNTER — Other Ambulatory Visit: Payer: Self-pay | Admitting: Advanced Practice Midwife

## 2023-02-11 DIAGNOSIS — Z1231 Encounter for screening mammogram for malignant neoplasm of breast: Secondary | ICD-10-CM

## 2023-02-16 ENCOUNTER — Ambulatory Visit: Payer: Medicaid Other

## 2023-02-17 ENCOUNTER — Ambulatory Visit
Admission: RE | Admit: 2023-02-17 | Discharge: 2023-02-17 | Disposition: A | Discharge status: Home / Self Care | Payer: Medicaid Other | Source: Ambulatory Visit | Attending: Advanced Practice Midwife | Admitting: Advanced Practice Midwife

## 2023-02-17 ENCOUNTER — Ambulatory Visit: Payer: Medicaid Other

## 2023-02-17 DIAGNOSIS — Z1231 Encounter for screening mammogram for malignant neoplasm of breast: Secondary | ICD-10-CM

## 2023-03-08 DIAGNOSIS — F339 Major depressive disorder, recurrent, unspecified: Secondary | ICD-10-CM | POA: Diagnosis not present

## 2023-03-08 DIAGNOSIS — F411 Generalized anxiety disorder: Secondary | ICD-10-CM | POA: Diagnosis not present

## 2023-03-15 DIAGNOSIS — F411 Generalized anxiety disorder: Secondary | ICD-10-CM | POA: Diagnosis not present

## 2023-03-15 DIAGNOSIS — F339 Major depressive disorder, recurrent, unspecified: Secondary | ICD-10-CM | POA: Diagnosis not present

## 2023-03-22 DIAGNOSIS — F339 Major depressive disorder, recurrent, unspecified: Secondary | ICD-10-CM | POA: Diagnosis not present

## 2023-03-22 DIAGNOSIS — F411 Generalized anxiety disorder: Secondary | ICD-10-CM | POA: Diagnosis not present

## 2023-03-29 DIAGNOSIS — F339 Major depressive disorder, recurrent, unspecified: Secondary | ICD-10-CM | POA: Diagnosis not present

## 2023-03-29 DIAGNOSIS — F411 Generalized anxiety disorder: Secondary | ICD-10-CM | POA: Diagnosis not present

## 2023-04-05 DIAGNOSIS — F411 Generalized anxiety disorder: Secondary | ICD-10-CM | POA: Diagnosis not present

## 2023-04-05 DIAGNOSIS — F339 Major depressive disorder, recurrent, unspecified: Secondary | ICD-10-CM | POA: Diagnosis not present

## 2023-04-12 DIAGNOSIS — F339 Major depressive disorder, recurrent, unspecified: Secondary | ICD-10-CM | POA: Diagnosis not present

## 2023-04-12 DIAGNOSIS — F411 Generalized anxiety disorder: Secondary | ICD-10-CM | POA: Diagnosis not present

## 2023-04-14 DIAGNOSIS — F339 Major depressive disorder, recurrent, unspecified: Secondary | ICD-10-CM | POA: Diagnosis not present

## 2023-04-14 DIAGNOSIS — F411 Generalized anxiety disorder: Secondary | ICD-10-CM | POA: Diagnosis not present

## 2023-04-15 DIAGNOSIS — F339 Major depressive disorder, recurrent, unspecified: Secondary | ICD-10-CM | POA: Diagnosis not present

## 2023-04-15 DIAGNOSIS — F411 Generalized anxiety disorder: Secondary | ICD-10-CM | POA: Diagnosis not present

## 2023-04-16 DIAGNOSIS — F339 Major depressive disorder, recurrent, unspecified: Secondary | ICD-10-CM | POA: Diagnosis not present

## 2023-04-16 DIAGNOSIS — F411 Generalized anxiety disorder: Secondary | ICD-10-CM | POA: Diagnosis not present

## 2023-04-17 DIAGNOSIS — F339 Major depressive disorder, recurrent, unspecified: Secondary | ICD-10-CM | POA: Diagnosis not present

## 2023-04-17 DIAGNOSIS — F411 Generalized anxiety disorder: Secondary | ICD-10-CM | POA: Diagnosis not present

## 2023-04-21 DIAGNOSIS — F339 Major depressive disorder, recurrent, unspecified: Secondary | ICD-10-CM | POA: Diagnosis not present

## 2023-04-21 DIAGNOSIS — F411 Generalized anxiety disorder: Secondary | ICD-10-CM | POA: Diagnosis not present

## 2023-04-22 DIAGNOSIS — F339 Major depressive disorder, recurrent, unspecified: Secondary | ICD-10-CM | POA: Diagnosis not present

## 2023-04-22 DIAGNOSIS — F411 Generalized anxiety disorder: Secondary | ICD-10-CM | POA: Diagnosis not present

## 2023-04-23 DIAGNOSIS — F411 Generalized anxiety disorder: Secondary | ICD-10-CM | POA: Diagnosis not present

## 2023-04-23 DIAGNOSIS — F339 Major depressive disorder, recurrent, unspecified: Secondary | ICD-10-CM | POA: Diagnosis not present

## 2023-04-24 DIAGNOSIS — F411 Generalized anxiety disorder: Secondary | ICD-10-CM | POA: Diagnosis not present

## 2023-04-24 DIAGNOSIS — F339 Major depressive disorder, recurrent, unspecified: Secondary | ICD-10-CM | POA: Diagnosis not present

## 2023-04-28 DIAGNOSIS — F339 Major depressive disorder, recurrent, unspecified: Secondary | ICD-10-CM | POA: Diagnosis not present

## 2023-04-28 DIAGNOSIS — F411 Generalized anxiety disorder: Secondary | ICD-10-CM | POA: Diagnosis not present

## 2023-04-29 DIAGNOSIS — F339 Major depressive disorder, recurrent, unspecified: Secondary | ICD-10-CM | POA: Diagnosis not present

## 2023-04-29 DIAGNOSIS — F411 Generalized anxiety disorder: Secondary | ICD-10-CM | POA: Diagnosis not present

## 2023-04-30 DIAGNOSIS — F339 Major depressive disorder, recurrent, unspecified: Secondary | ICD-10-CM | POA: Diagnosis not present

## 2023-04-30 DIAGNOSIS — F411 Generalized anxiety disorder: Secondary | ICD-10-CM | POA: Diagnosis not present

## 2023-05-01 DIAGNOSIS — F339 Major depressive disorder, recurrent, unspecified: Secondary | ICD-10-CM | POA: Diagnosis not present

## 2023-05-01 DIAGNOSIS — F411 Generalized anxiety disorder: Secondary | ICD-10-CM | POA: Diagnosis not present

## 2023-05-05 DIAGNOSIS — F339 Major depressive disorder, recurrent, unspecified: Secondary | ICD-10-CM | POA: Diagnosis not present

## 2023-05-05 DIAGNOSIS — F411 Generalized anxiety disorder: Secondary | ICD-10-CM | POA: Diagnosis not present

## 2023-05-06 DIAGNOSIS — F411 Generalized anxiety disorder: Secondary | ICD-10-CM | POA: Diagnosis not present

## 2023-05-06 DIAGNOSIS — F339 Major depressive disorder, recurrent, unspecified: Secondary | ICD-10-CM | POA: Diagnosis not present

## 2023-05-07 DIAGNOSIS — F339 Major depressive disorder, recurrent, unspecified: Secondary | ICD-10-CM | POA: Diagnosis not present

## 2023-05-07 DIAGNOSIS — F411 Generalized anxiety disorder: Secondary | ICD-10-CM | POA: Diagnosis not present

## 2023-05-12 DIAGNOSIS — F339 Major depressive disorder, recurrent, unspecified: Secondary | ICD-10-CM | POA: Diagnosis not present

## 2023-05-12 DIAGNOSIS — F411 Generalized anxiety disorder: Secondary | ICD-10-CM | POA: Diagnosis not present

## 2023-05-14 DIAGNOSIS — F411 Generalized anxiety disorder: Secondary | ICD-10-CM | POA: Diagnosis not present

## 2023-05-14 DIAGNOSIS — F339 Major depressive disorder, recurrent, unspecified: Secondary | ICD-10-CM | POA: Diagnosis not present

## 2023-05-17 ENCOUNTER — Ambulatory Visit: Admitting: Obstetrics and Gynecology

## 2023-05-19 DIAGNOSIS — F339 Major depressive disorder, recurrent, unspecified: Secondary | ICD-10-CM | POA: Diagnosis not present

## 2023-05-19 DIAGNOSIS — F411 Generalized anxiety disorder: Secondary | ICD-10-CM | POA: Diagnosis not present

## 2023-05-20 DIAGNOSIS — F339 Major depressive disorder, recurrent, unspecified: Secondary | ICD-10-CM | POA: Diagnosis not present

## 2023-05-20 DIAGNOSIS — F411 Generalized anxiety disorder: Secondary | ICD-10-CM | POA: Diagnosis not present

## 2023-05-21 DIAGNOSIS — F411 Generalized anxiety disorder: Secondary | ICD-10-CM | POA: Diagnosis not present

## 2023-05-21 DIAGNOSIS — F339 Major depressive disorder, recurrent, unspecified: Secondary | ICD-10-CM | POA: Diagnosis not present

## 2023-05-24 ENCOUNTER — Ambulatory Visit (INDEPENDENT_AMBULATORY_CARE_PROVIDER_SITE_OTHER): Admitting: Obstetrics and Gynecology

## 2023-05-24 ENCOUNTER — Encounter: Payer: Self-pay | Admitting: Obstetrics and Gynecology

## 2023-05-24 ENCOUNTER — Other Ambulatory Visit: Payer: Self-pay

## 2023-05-24 VITALS — BP 131/82 | HR 88

## 2023-05-24 DIAGNOSIS — N949 Unspecified condition associated with female genital organs and menstrual cycle: Secondary | ICD-10-CM | POA: Diagnosis not present

## 2023-05-24 DIAGNOSIS — N750 Cyst of Bartholin's gland: Secondary | ICD-10-CM | POA: Diagnosis not present

## 2023-05-24 DIAGNOSIS — N941 Unspecified dyspareunia: Secondary | ICD-10-CM

## 2023-05-24 MED ORDER — SULFAMETHOXAZOLE-TRIMETHOPRIM 800-160 MG PO TABS: 1.0000 | ORAL_TABLET | Freq: Two times a day (BID) | ORAL | 0 refills | Status: AC

## 2023-05-24 NOTE — Progress Notes (Signed)
 Obstetrics and Gynecology Visit New Patient Evaluation  Appointment Date: 05/24/2023  Primary Care Provider: Patient, No Pcp Per  OBGYN Clinic: [facility]  Chief Complaint: vaginal lump, pain with sex  History of Present Illness:  Sara Pratt is a 48 y.o. with above CC. Patient states that she had sex a month ago that was rough and had pain afterwards and then a vaginal right sided lump and pain with it. The size has decreased considerably since then. No AUB, discharge or itching and no h/o prior s/s. Hx significant for h/o c-hyst, trich, pelvic adhesive disease, h/o LSO.   Review of Systems: as noted in the History of Present Illness.  Medications:  Marialice N. Siegenthaler had no medications administered during this visit. Current Outpatient Medications  Medication Sig Dispense Refill   sulfamethoxazole -trimethoprim  (BACTRIM  DS) 800-160 MG tablet Take 1 tablet by mouth 2 (two) times daily for 14 days. 28 tablet 0   albuterol  (VENTOLIN  HFA) 108 (90 Base) MCG/ACT inhaler Inhale 1-2 puffs into the lungs every 6 (six) hours as needed for wheezing or shortness of breath. (Patient not taking: Reported on 05/24/2023) 1 each 0   azithromycin  (ZITHROMAX ) 250 MG tablet Take 1 tablet (250 mg total) by mouth daily. Take first 2 tablets together, then 1 every day until finished. (Patient not taking: Reported on 05/24/2023) 6 tablet 0   CREATINE PO Take 3 tablets by mouth as needed. Up to 6 per day (Patient not taking: Reported on 05/24/2023)     diclofenac  Sodium (VOLTAREN ) 1 % GEL Apply 2 g topically 4 (four) times daily. (Patient not taking: Reported on 05/24/2023) 2 g 0   famotidine (PEPCID) 40 MG tablet Take by mouth. (Patient not taking: Reported on 05/24/2023)     fluconazole  (DIFLUCAN ) 150 MG tablet Take 1 tablet (150 mg total) by mouth every 3 (three) days. For three doses (Patient not taking: Reported on 05/24/2023) 3 tablet 3   meloxicam  (MOBIC ) 15 MG tablet Take  1 tablet (15 mg total) by mouth daily. (Patient not taking: Reported on 05/24/2023) 30 tablet 0   methylPREDNISolone  (MEDROL  DOSEPAK) 4 MG TBPK tablet 6 Day Taper Pack. Take as Directed. (Patient not taking: Reported on 05/24/2023) 21 tablet 0   metroNIDAZOLE  (METROGEL ) 0.75 % vaginal gel Place 1 Applicatorful vaginally at bedtime. Apply one applicatorful to vagina at bedtime for 10 days, then twice a week for 6 months. (Patient not taking: Reported on 05/24/2023) 70 g 5   ondansetron  (ZOFRAN -ODT) 4 MG disintegrating tablet Take 1 tablet (4 mg total) by mouth every 8 (eight) hours as needed for nausea or vomiting. (Patient not taking: Reported on 05/24/2023) 12 tablet 0   PRESCRIPTION MEDICATION Taking something for acid reflux, unsure of name (Patient not taking: Reported on 05/24/2023)     No current facility-administered medications for this visit.    Allergies: is allergic to nsaids.  Physical Exam:  BP 131/82   Pulse 88  There is no height or weight on file to calculate BMI. General appearance: Well nourished, well developed female in no acute distress.  Abdomen: diffusely non tender to palpation, non distended, and no masses, hernias Neuro/Psych:  Normal mood and affect.    Pelvic exam:  Pelvic exam chaperoned by CMA EGBUS: on the left side there is minimally ttp, 3cm bartholin's. No inguinal LAD. Pt declines speculum exam, pap  Assessment: patient improving  Plan:  1. Vaginal lump (Primary)  2. Bartholin's gland cyst D/w her recommend 2  week abx course and bid sitz baths and f/u in 3-4 weeks. If still there or worsens, d/w her will need I&D in office. Bactrim  1tab po bid x 14d sent in   Patient declines any STI testing today.   3. Dyspareunia in female  4. Cervical cancer screening F/u next visit to see if patient amenable.     Return in about 3 weeks (around 06/14/2023).  Future Appointments  Date Time Provider Department Center  06/16/2023  1:55 PM Raynell Caller, MD  [facility]    Tyler Gallant MD Attending Center for [facility])

## 2023-05-26 DIAGNOSIS — F339 Major depressive disorder, recurrent, unspecified: Secondary | ICD-10-CM | POA: Diagnosis not present

## 2023-05-26 DIAGNOSIS — F411 Generalized anxiety disorder: Secondary | ICD-10-CM | POA: Diagnosis not present

## 2023-05-31 ENCOUNTER — Ambulatory Visit: Admitting: Obstetrics and Gynecology

## 2023-06-03 DIAGNOSIS — F339 Major depressive disorder, recurrent, unspecified: Secondary | ICD-10-CM | POA: Diagnosis not present

## 2023-06-03 DIAGNOSIS — F411 Generalized anxiety disorder: Secondary | ICD-10-CM | POA: Diagnosis not present

## 2023-06-04 DIAGNOSIS — F339 Major depressive disorder, recurrent, unspecified: Secondary | ICD-10-CM | POA: Diagnosis not present

## 2023-06-04 DIAGNOSIS — F411 Generalized anxiety disorder: Secondary | ICD-10-CM | POA: Diagnosis not present

## 2023-06-05 DIAGNOSIS — F339 Major depressive disorder, recurrent, unspecified: Secondary | ICD-10-CM | POA: Diagnosis not present

## 2023-06-05 DIAGNOSIS — F411 Generalized anxiety disorder: Secondary | ICD-10-CM | POA: Diagnosis not present

## 2023-06-10 DIAGNOSIS — F339 Major depressive disorder, recurrent, unspecified: Secondary | ICD-10-CM | POA: Diagnosis not present

## 2023-06-10 DIAGNOSIS — F411 Generalized anxiety disorder: Secondary | ICD-10-CM | POA: Diagnosis not present

## 2023-06-11 DIAGNOSIS — F339 Major depressive disorder, recurrent, unspecified: Secondary | ICD-10-CM | POA: Diagnosis not present

## 2023-06-11 DIAGNOSIS — F411 Generalized anxiety disorder: Secondary | ICD-10-CM | POA: Diagnosis not present

## 2023-06-12 DIAGNOSIS — F411 Generalized anxiety disorder: Secondary | ICD-10-CM | POA: Diagnosis not present

## 2023-06-12 DIAGNOSIS — F339 Major depressive disorder, recurrent, unspecified: Secondary | ICD-10-CM | POA: Diagnosis not present

## 2023-06-16 ENCOUNTER — Ambulatory Visit: Admitting: Family Medicine

## 2023-06-17 DIAGNOSIS — F339 Major depressive disorder, recurrent, unspecified: Secondary | ICD-10-CM | POA: Diagnosis not present

## 2023-06-17 DIAGNOSIS — F411 Generalized anxiety disorder: Secondary | ICD-10-CM | POA: Diagnosis not present

## 2023-06-18 DIAGNOSIS — F339 Major depressive disorder, recurrent, unspecified: Secondary | ICD-10-CM | POA: Diagnosis not present

## 2023-06-18 DIAGNOSIS — F411 Generalized anxiety disorder: Secondary | ICD-10-CM | POA: Diagnosis not present

## 2023-06-24 DIAGNOSIS — F339 Major depressive disorder, recurrent, unspecified: Secondary | ICD-10-CM | POA: Diagnosis not present

## 2023-06-24 DIAGNOSIS — F411 Generalized anxiety disorder: Secondary | ICD-10-CM | POA: Diagnosis not present

## 2023-06-29 ENCOUNTER — Emergency Department (HOSPITAL_BASED_OUTPATIENT_CLINIC_OR_DEPARTMENT_OTHER)
Admission: EM | Admit: 2023-06-29 | Discharge: 2023-06-30 | Disposition: A | Discharge status: Home / Self Care | Payer: Worker's Compensation | Attending: Emergency Medicine | Admitting: Emergency Medicine

## 2023-06-29 ENCOUNTER — Encounter (HOSPITAL_BASED_OUTPATIENT_CLINIC_OR_DEPARTMENT_OTHER): Payer: Self-pay | Admitting: Emergency Medicine

## 2023-06-29 ENCOUNTER — Emergency Department (HOSPITAL_BASED_OUTPATIENT_CLINIC_OR_DEPARTMENT_OTHER): Payer: Worker's Compensation | Admitting: Radiology

## 2023-06-29 ENCOUNTER — Other Ambulatory Visit: Payer: Self-pay

## 2023-06-29 ENCOUNTER — Emergency Department (HOSPITAL_COMMUNITY): Admission: EM | Admit: 2023-06-29 | Discharge: 2023-06-29 | Discharge status: Against Medical Advice

## 2023-06-29 DIAGNOSIS — Z79899 Other long term (current) drug therapy: Secondary | ICD-10-CM | POA: Insufficient documentation

## 2023-06-29 DIAGNOSIS — Y990 Civilian activity done for income or pay: Secondary | ICD-10-CM | POA: Insufficient documentation

## 2023-06-29 DIAGNOSIS — S61217A Laceration without foreign body of left little finger without damage to nail, initial encounter: Secondary | ICD-10-CM | POA: Insufficient documentation

## 2023-06-29 DIAGNOSIS — Z23 Encounter for immunization: Secondary | ICD-10-CM | POA: Diagnosis not present

## 2023-06-29 DIAGNOSIS — S6992XA Unspecified injury of left wrist, hand and finger(s), initial encounter: Secondary | ICD-10-CM | POA: Diagnosis present

## 2023-06-29 DIAGNOSIS — W268XXA Contact with other sharp object(s), not elsewhere classified, initial encounter: Secondary | ICD-10-CM | POA: Diagnosis not present

## 2023-06-29 MED ORDER — TETANUS-DIPHTH-ACELL PERTUSSIS 5-2.5-18.5 LF-MCG/0.5 IM SUSY
0.5000 mL | PREFILLED_SYRINGE | Freq: Once | INTRAMUSCULAR | Status: AC
  Administered 2023-06-30: 0.5 mL via INTRAMUSCULAR
  Filled 2023-06-29: qty 0.5

## 2023-06-29 MED ORDER — LIDOCAINE HCL (PF) 1 % IJ SOLN
5.0000 mL | Freq: Once | INTRAMUSCULAR | Status: AC
  Administered 2023-06-30: 5 mL
  Filled 2023-06-29: qty 5

## 2023-06-29 MED ORDER — CEPHALEXIN 500 MG PO CAPS: 500.0000 mg | ORAL_CAPSULE | Freq: Three times a day (TID) | ORAL | 0 refills | Status: AC

## 2023-06-29 NOTE — Discharge Instructions (Signed)
 You had 5 stitches putting.  These will need to be removed in 7 days.  You can have your primary care doctor take these out or return to the emergency department.  Antibiotic sent into the pharmacy.  Return for any emergent symptoms.

## 2023-06-29 NOTE — ED Triage Notes (Signed)
 Pt via POV c/o laceration to left pinky with deep wound noted. She sliced her finger with a box cutter at work. Bleeding controlled in triage with gauze and pressure applied. Last tetanus shot unknown.

## 2023-06-29 NOTE — ED Provider Notes (Signed)
 [place] EMERGENCY DEPARTMENT AT [facility] Provider Note   CSN: [id number] Arrival date & time: 06/29/23  2002     History  Chief Complaint  Patient presents with   Laceration    Sara Pratt is a 48 y.o. female.  48 year old female presents today for concern of left pinky laceration that occurred just prior to arrival at work.  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  No other injuries.  Unknown last tetanus shot.  The history is provided by the patient. No language interpreter was used.       Home Medications Prior to Admission medications   Medication Sig Start Date End Date Taking? Authorizing Provider  cephALEXin (KEFLEX) 500 MG capsule Take 1 capsule (500 mg total) by mouth 3 (three) times daily for 7 days. 06/29/23 07/06/23 Yes Sonny Anthes, PA-C  albuterol  (VENTOLIN  HFA) 108 (90 Base) MCG/ACT inhaler Inhale 1-2 puffs into the lungs every 6 (six) hours as needed for wheezing or shortness of breath. Patient not taking: Reported on 05/24/2023 04/30/21   Billie Budge, MD  azithromycin  (ZITHROMAX ) 250 MG tablet Take 1 tablet (250 mg total) by mouth daily. Take first 2 tablets together, then 1 every day until finished. Patient not taking: Reported on 05/24/2023 04/30/21   Billie Budge, MD  CREATINE PO Take 3 tablets by mouth as needed. Up to 6 per day Patient not taking: Reported on 05/24/2023    [provider]  diclofenac  Sodium (VOLTAREN ) 1 % GEL Apply 2 g topically 4 (four) times daily. Patient not taking: Reported on 05/24/2023 11/07/20   Jinwala, Sagar H, MD  famotidine (PEPCID) 40 MG tablet Take by mouth. Patient not taking: Reported on 05/24/2023 08/17/19   [provider]  fluconazole  (DIFLUCAN ) 150 MG tablet Take 1 tablet (150 mg total) by mouth every 3 (three) days. For three doses Patient not taking: Reported on 05/24/2023 08/23/21   Julianne Octave, MD  meloxicam  (MOBIC ) 15 MG tablet Take 1 tablet (15 mg  total) by mouth daily. Patient not taking: Reported on 05/24/2023 11/12/20   Sikora, Rebecca, DPM  methylPREDNISolone  (MEDROL  DOSEPAK) 4 MG TBPK tablet 6 Day Taper Pack. Take as Directed. Patient not taking: Reported on 05/24/2023 09/13/20   Price, Michael J, DPM  metroNIDAZOLE  (METROGEL ) 0.75 % vaginal gel Place 1 Applicatorful vaginally at bedtime. Apply one applicatorful to vagina at bedtime for 10 days, then twice a week for 6 months. Patient not taking: Reported on 05/24/2023 11/12/21   Felipe Horton, [place] , CNM  ondansetron  (ZOFRAN -ODT) 4 MG disintegrating tablet Take 1 tablet (4 mg total) by mouth every 8 (eight) hours as needed for nausea or vomiting. Patient not taking: Reported on 05/24/2023 09/10/22   Kelsey Patricia, MD  PRESCRIPTION MEDICATION Taking something for acid reflux, unsure of name Patient not taking: Reported on 05/24/2023    [provider]      Allergies    Nsaids    Review of Systems   Review of Systems  Skin:  Positive for wound.    Physical Exam Updated Vital Signs BP 133/89   Pulse 71   Temp (!) 97.5 F (36.4 C) (Temporal)   Resp 20   Ht 5\' 4"  (1.626 m)   Wt 77.6 kg   SpO2 100%   BMI 29.35 kg/m  Physical Exam Vitals and nursing note reviewed.  Constitutional:      General: She is not in acute distress.    Appearance:  Normal appearance. She is not ill-appearing.  HENT:     Head: Normocephalic and atraumatic.     Nose: Nose normal.  Eyes:     Conjunctiva/sclera: Conjunctivae normal.  Pulmonary:     Effort: Pulmonary effort is normal. No respiratory distress.  Musculoskeletal:        General: No deformity.  Skin:    Findings: No rash.     Comments: 3 cm laceration noted over the volar aspect of proximal little finger.  Minimal active bleeding.  Neurovascularly intact.  Good range of motion.  Neurological:     Mental Status: She is alert.     ED Results / Procedures / Treatments   Labs (all labs ordered are listed, but only abnormal  results are displayed) Labs Reviewed - No data to display  EKG None  Radiology DG Finger Little Left Result Date: 06/29/2023 CLINICAL DATA:  Laceration EXAM: LEFT FINGER(S) - 2+ VIEW COMPARISON:  None Available. FINDINGS: There is no evidence of fracture or dislocation. There is no evidence of arthropathy or other focal bone abnormality. Laceration not well visualized with overlying gauze. No definite retained radiopaque foreign body. IMPRESSION: 1. No acute displaced fracture or dislocation. 2. Laceration not well visualized with overlying gauze. No definite retained radiopaque foreign body. Electronically Signed   By: Morgane  Naveau M.D.   On: 06/29/2023 20:33    Procedures .Aaron AasLac repair Mir Fullilove  Date/Time: 06/29/2023 11:57 PM  Performed by: Lucina Sabal, PA-C Authorized by: Lucina Sabal, PA-C   Consent:    Consent obtained:  Verbal   Consent given by:  Patient   Risks discussed:  Need for additional repair, infection, retained foreign body, poor cosmetic result and poor wound healing   Alternatives discussed:  No treatment Universal protocol:    Procedure explained and questions answered to patient or proxy's satisfaction: yes     Relevant documents present and verified: yes     Patient identity confirmed:  Verbally with patient and arm band Laceration details:    Location:  Finger   Finger location:  L small finger   Length (cm):  3 Pre-procedure details:    Preparation:  Patient was prepped and draped in usual sterile fashion Treatment:    Area cleansed with:  Saline and povidone-iodine   Amount of cleaning:  Extensive   Irrigation solution:  Sterile saline   Irrigation method:  Tap   Debridement:  None   Undermining:  None Skin repair:    Repair method:  Sutures   Suture size:  5-0   Suture material:  Prolene   Suture technique:  Simple interrupted   Number of sutures:  5 Approximation:    Approximation:  Close Repair type:    Repair type:  Simple Post-procedure  details:    Dressing:  Non-adherent dressing   Procedure completion:  Tolerated well, no immediate complications     Medications Ordered in ED Medications  lidocaine  (PF) (XYLOCAINE ) 1 % injection 5 mL (has no administration in time range)  Tdap (BOOSTRIX ) injection 0.5 mL (has no administration in time range)    ED Course/ Medical Decision Making/ A&P                                 Medical Decision Making Amount and/or Complexity of Data Reviewed Radiology: ordered.  Risk Prescription drug management.   48 year old female presents today for concern of left pinky laceration.  She is right-hand dominant.  See attached procedure note. X-ray without acute bony abnormality or retained foreign body. Discharged in stable condition.  Keflex  prescribed.  Bacitracin provided at discharge as well. Tetanus updated.    Final Clinical Impression(s) / ED Diagnoses Final diagnoses:  Laceration of left little finger without foreign body without damage to nail, initial encounter    Rx / DC Orders ED Discharge Orders          Ordered    cephALEXin  (KEFLEX ) 500 MG capsule  3 times daily        06/29/23 2355              Lucina Sabal, PA-C 06/29/23 2357    Lowery Rue, DO 06/30/23 1847

## 2023-06-29 NOTE — ED Notes (Signed)
 Pt did not answer to call  for triage

## 2023-06-29 NOTE — ED Notes (Signed)
 Suture cart and meds at the bedside

## 2023-07-01 DIAGNOSIS — F339 Major depressive disorder, recurrent, unspecified: Secondary | ICD-10-CM | POA: Diagnosis not present

## 2023-07-01 DIAGNOSIS — F411 Generalized anxiety disorder: Secondary | ICD-10-CM | POA: Diagnosis not present

## 2023-07-02 DIAGNOSIS — F411 Generalized anxiety disorder: Secondary | ICD-10-CM | POA: Diagnosis not present

## 2023-07-02 DIAGNOSIS — F339 Major depressive disorder, recurrent, unspecified: Secondary | ICD-10-CM | POA: Diagnosis not present

## 2023-07-03 DIAGNOSIS — F411 Generalized anxiety disorder: Secondary | ICD-10-CM | POA: Diagnosis not present

## 2023-07-03 DIAGNOSIS — F339 Major depressive disorder, recurrent, unspecified: Secondary | ICD-10-CM | POA: Diagnosis not present

## 2023-07-07 ENCOUNTER — Other Ambulatory Visit: Payer: Self-pay

## 2023-07-07 ENCOUNTER — Emergency Department (HOSPITAL_BASED_OUTPATIENT_CLINIC_OR_DEPARTMENT_OTHER)
Admission: EM | Admit: 2023-07-07 | Discharge: 2023-07-07 | Disposition: A | Discharge status: Home / Self Care | Attending: Emergency Medicine | Admitting: Emergency Medicine

## 2023-07-07 ENCOUNTER — Encounter (HOSPITAL_BASED_OUTPATIENT_CLINIC_OR_DEPARTMENT_OTHER): Payer: Self-pay

## 2023-07-07 DIAGNOSIS — S61217D Laceration without foreign body of left little finger without damage to nail, subsequent encounter: Secondary | ICD-10-CM | POA: Diagnosis not present

## 2023-07-07 DIAGNOSIS — Z4802 Encounter for removal of sutures: Secondary | ICD-10-CM | POA: Diagnosis not present

## 2023-07-07 NOTE — ED Provider Notes (Signed)
 [place] EMERGENCY DEPARTMENT AT [facility] Provider Note   CSN: [id number] Arrival date & time: 07/07/23  8295     History  Chief Complaint  Patient presents with   Suture / Staple Removal    Sara Pratt is a 48 y.o. female.  Patient presents to the emergency department today for evaluation of suture removal left little finger.  Patient had 5 Prolene sutures placed 7-8 days ago.  This has been healing well.  She reports a little bit of soreness at the periphery of the wound, but no drainage.  She is left it open to the air the past 2 days.  No redness or warmth.  No fevers.  Patient works as a Conservation officer, [employer].       Home Medications Prior to Admission medications   Not on File      Allergies    Nsaids    Review of Systems   Review of Systems  Physical Exam Updated Vital Signs BP 123/79 (BP Location: Left Arm)   Pulse 66   Temp 98.2 F (36.8 C) (Oral)   Resp 18   SpO2 100%  Physical Exam Vitals and nursing note reviewed.  Constitutional:      Appearance: She is well-developed.  HENT:     Head: Normocephalic and atraumatic.  Eyes:     Conjunctiva/sclera: Conjunctivae normal.  Pulmonary:     Effort: No respiratory distress.  Musculoskeletal:     Cervical back: Normal range of motion and neck supple.  Skin:    General: Skin is warm and dry.     Comments: Well-healing wound, clean, no signs of cellulitis.  Volar aspect of the left small finger.  5 Prolene sutures in place.  Neurological:     Mental Status: She is alert.     ED Results / Procedures / Treatments   Labs (all labs ordered are listed, but only abnormal results are displayed) Labs Reviewed - No data to display  EKG None  Radiology No results found.  Procedures Suture Removal  Date/Time: 07/07/2023 10:17 AM  Performed by: Lyna Sandhoff, PA-C Authorized by: Lyna Sandhoff, PA-C   Consent:    Consent obtained:  Verbal   Consent given by:  Patient Universal protocol:     Patient identity confirmed:  Verbally with patient Procedure details:    Wound appearance:  No signs of infection and good wound healing   Number of sutures removed:  5 Post-procedure details:    Post-procedure assessment: Dermabond applied.   Procedure completion:  Tolerated well, no immediate complications Comments:     Sutures removed carefully to ensure no signs of dehiscence.  The wound did not dehisce.  However patient does work as a Conservation officer, nature and I wanted to provide some additional support for the wound while it continues to heal as patient does use her hands.  Patient agreed to allow me to put a strip of Dermabond on over the area.  Again absolutely no signs of infection.     Medications Ordered in ED Medications - No data to display  ED Course/ Medical Decision Making/ A&P    Patient seen and examined. History obtained directly from patient. Work-up including labs, imaging, EKG ordered in triage, if performed, were reviewed.    Labs/EKG: None ordered  Imaging: None ordered  Medications/Fluids: Ordered: Tissue adhesive  Most recent vital signs reviewed and are as follows: BP 123/79 (BP Location: Left Arm)   Pulse 66   Temp 98.2 F (36.8 C) (Oral)  Resp 18   SpO2 100%   Initial impression: Encounter for suture removal, left small finger laceration, healing well.  Home treatment plan: Discussed wound care  Return instructions discussed with patient: Return with worsening pain, redness, swelling, drainage.  Follow-up instructions discussed with patient: PCP as needed                                Medical Decision Making  Patient here for suture removal.  She has good range of motion of the digit.  No evidence of flexor tendon injury based on normal range of motion and strength.  Wound appears to be healing well, no signs of infection.  Suture removal and Dermabond application as above.        Final Clinical Impression(s) / ED Diagnoses Final diagnoses:   Visit for suture removal    Rx / DC Orders ED Discharge Orders     None         Lyna Sandhoff, PA-C 07/07/23 1019    Tegeler, Marine Sia, MD 07/07/23 1433

## 2023-07-07 NOTE — ED Triage Notes (Signed)
 In for eval of suture removal to left 5th finger. Placed on 06/29/2023.

## 2023-07-08 DIAGNOSIS — F339 Major depressive disorder, recurrent, unspecified: Secondary | ICD-10-CM | POA: Diagnosis not present

## 2023-07-08 DIAGNOSIS — F411 Generalized anxiety disorder: Secondary | ICD-10-CM | POA: Diagnosis not present

## 2023-07-09 DIAGNOSIS — F411 Generalized anxiety disorder: Secondary | ICD-10-CM | POA: Diagnosis not present

## 2023-07-09 DIAGNOSIS — F339 Major depressive disorder, recurrent, unspecified: Secondary | ICD-10-CM | POA: Diagnosis not present

## 2023-07-10 DIAGNOSIS — F411 Generalized anxiety disorder: Secondary | ICD-10-CM | POA: Diagnosis not present

## 2023-07-10 DIAGNOSIS — F339 Major depressive disorder, recurrent, unspecified: Secondary | ICD-10-CM | POA: Diagnosis not present

## 2023-07-14 DIAGNOSIS — F411 Generalized anxiety disorder: Secondary | ICD-10-CM | POA: Diagnosis not present

## 2023-07-14 DIAGNOSIS — F339 Major depressive disorder, recurrent, unspecified: Secondary | ICD-10-CM | POA: Diagnosis not present

## 2023-07-15 DIAGNOSIS — F339 Major depressive disorder, recurrent, unspecified: Secondary | ICD-10-CM | POA: Diagnosis not present

## 2023-07-15 DIAGNOSIS — F411 Generalized anxiety disorder: Secondary | ICD-10-CM | POA: Diagnosis not present

## 2023-07-16 ENCOUNTER — Ambulatory Visit: Admitting: Obstetrics and Gynecology

## 2023-07-16 DIAGNOSIS — F339 Major depressive disorder, recurrent, unspecified: Secondary | ICD-10-CM | POA: Diagnosis not present

## 2023-07-16 DIAGNOSIS — F411 Generalized anxiety disorder: Secondary | ICD-10-CM | POA: Diagnosis not present

## 2023-07-16 NOTE — Progress Notes (Deleted)
    GYNECOLOGY VISIT  Patient name: Sara Pratt MRN [id number]  Date of birth: 04-09-1975 Chief Complaint:   No chief complaint on file.   History:  Sara Pratt is a 48 y.o. G6P0 being seen today for ***.    Last seen 05/24/23: bartholin's galdn dyst, treated with 2 week abx course, sitz baths  Past Medical History:  Diagnosis Date   Anxiety    Arthritis    Asthma    Delivery by cesarean hysterectomy 11/04/2021   For uterine rupture. No history of abnl Pap.     GERD (gastroesophageal reflux disease)    Peritoneal adhesions 11/27/2015   Pneumonia    PONV (postoperative nausea and vomiting)    Scoliosis    Trichomonal vaginitis 08/21/2021   Urinary incontinence 03/25/2016    Past Surgical History:  Procedure Laterality Date   ABDOMINAL HYSTERECTOMY  2013   C-Hyst for Uterine Rupture. Unsure if supracervical   ABDOMINOPLASTY     APPENDECTOMY     CESAREAN SECTION     CHOLECYSTECTOMY     HERNIA REPAIR     LAPAROSCOPY  11/12/2015   Procedure: LAPAROSCOPY DIAGNOSTIC;  Surgeon: Verlyn Goad, MD;  Location: [facility];  Service: Gynecology;;   LYSIS OF ADHESION  11/12/2015   Procedure: LYSIS OF ADHESION;  Surgeon: Verlyn Goad, MD;  Location: [facility];  Service: Gynecology;;   LYSIS OF ADHESION Left 03/03/2016   Procedure: LYSIS OF ADHESION;  Surgeon: Alphonso Aschoff, MD;  Location: [facility];  Service: Gynecology;  Laterality: Left;   ROBOTIC ASSISTED BILATERAL SALPINGO OOPHERECTOMY Left 03/03/2016   Procedure: XI ROBOTIC ASSISTED LEFT  SALPINGO OOPHORECTOMY;  Surgeon: Alphonso Aschoff, MD;  Location: [facility];  Service: Gynecology;  Laterality: Left;    The following portions of the patient's history were reviewed and updated as appropriate: allergies, current medications, past family history, past medical history, past social history, past surgical history and problem list.   Health Maintenance:   Last pap ***. Results were: {Pap findings:25134}. H/O abnormal pap:  {yes/yes***/no:23866} Last mammogram: ***. Results were: {normal, abnormal, n/a:23837}. Family h/o breast cancer: {yes***/no:23838}   Review of Systems:  {Ros - complete:30496} Comprehensive review of systems was otherwise negative.   Objective:  Physical Exam There were no vitals taken for this visit.   Physical Exam   Labs and Imaging DG Finger Little Left Result Date: 06/29/2023 CLINICAL DATA:  Laceration EXAM: LEFT FINGER(S) - 2+ VIEW COMPARISON:  None Available. FINDINGS: There is no evidence of fracture or dislocation. There is no evidence of arthropathy or other focal bone abnormality. Laceration not well visualized with overlying gauze. No definite retained radiopaque foreign body. IMPRESSION: 1. No acute displaced fracture or dislocation. 2. Laceration not well visualized with overlying gauze. No definite retained radiopaque foreign body. Electronically Signed   By: Morgane  Naveau M.D.   On: 06/29/2023 20:33       Assessment & Plan:   There are no diagnoses linked to this encounter.   *** Routine preventative health maintenance measures emphasized.  Kiki Pelton, MD [facility]ally [facility], [facility]

## 2023-07-21 DIAGNOSIS — F411 Generalized anxiety disorder: Secondary | ICD-10-CM | POA: Diagnosis not present

## 2023-07-21 DIAGNOSIS — F339 Major depressive disorder, recurrent, unspecified: Secondary | ICD-10-CM | POA: Diagnosis not present

## 2023-07-22 DIAGNOSIS — F339 Major depressive disorder, recurrent, unspecified: Secondary | ICD-10-CM | POA: Diagnosis not present

## 2023-07-22 DIAGNOSIS — F411 Generalized anxiety disorder: Secondary | ICD-10-CM | POA: Diagnosis not present

## 2023-07-23 DIAGNOSIS — F411 Generalized anxiety disorder: Secondary | ICD-10-CM | POA: Diagnosis not present

## 2023-07-23 DIAGNOSIS — F339 Major depressive disorder, recurrent, unspecified: Secondary | ICD-10-CM | POA: Diagnosis not present

## 2023-07-28 DIAGNOSIS — F339 Major depressive disorder, recurrent, unspecified: Secondary | ICD-10-CM | POA: Diagnosis not present

## 2023-07-28 DIAGNOSIS — F411 Generalized anxiety disorder: Secondary | ICD-10-CM | POA: Diagnosis not present

## 2023-07-29 DIAGNOSIS — F339 Major depressive disorder, recurrent, unspecified: Secondary | ICD-10-CM | POA: Diagnosis not present

## 2023-07-29 DIAGNOSIS — F411 Generalized anxiety disorder: Secondary | ICD-10-CM | POA: Diagnosis not present

## 2023-07-30 DIAGNOSIS — F411 Generalized anxiety disorder: Secondary | ICD-10-CM | POA: Diagnosis not present

## 2023-07-30 DIAGNOSIS — F339 Major depressive disorder, recurrent, unspecified: Secondary | ICD-10-CM | POA: Diagnosis not present

## 2023-08-04 DIAGNOSIS — F411 Generalized anxiety disorder: Secondary | ICD-10-CM | POA: Diagnosis not present

## 2023-08-04 DIAGNOSIS — F339 Major depressive disorder, recurrent, unspecified: Secondary | ICD-10-CM | POA: Diagnosis not present

## 2023-08-05 DIAGNOSIS — F339 Major depressive disorder, recurrent, unspecified: Secondary | ICD-10-CM | POA: Diagnosis not present

## 2023-08-05 DIAGNOSIS — F411 Generalized anxiety disorder: Secondary | ICD-10-CM | POA: Diagnosis not present

## 2023-08-06 DIAGNOSIS — F339 Major depressive disorder, recurrent, unspecified: Secondary | ICD-10-CM | POA: Diagnosis not present

## 2023-08-06 DIAGNOSIS — F411 Generalized anxiety disorder: Secondary | ICD-10-CM | POA: Diagnosis not present

## 2023-08-11 DIAGNOSIS — F339 Major depressive disorder, recurrent, unspecified: Secondary | ICD-10-CM | POA: Diagnosis not present

## 2023-08-11 DIAGNOSIS — F411 Generalized anxiety disorder: Secondary | ICD-10-CM | POA: Diagnosis not present

## 2023-08-12 DIAGNOSIS — F411 Generalized anxiety disorder: Secondary | ICD-10-CM | POA: Diagnosis not present

## 2023-08-12 DIAGNOSIS — F339 Major depressive disorder, recurrent, unspecified: Secondary | ICD-10-CM | POA: Diagnosis not present

## 2023-08-18 DIAGNOSIS — F339 Major depressive disorder, recurrent, unspecified: Secondary | ICD-10-CM | POA: Diagnosis not present

## 2023-08-18 DIAGNOSIS — F411 Generalized anxiety disorder: Secondary | ICD-10-CM | POA: Diagnosis not present

## 2023-08-19 DIAGNOSIS — F339 Major depressive disorder, recurrent, unspecified: Secondary | ICD-10-CM | POA: Diagnosis not present

## 2023-08-19 DIAGNOSIS — F411 Generalized anxiety disorder: Secondary | ICD-10-CM | POA: Diagnosis not present

## 2023-08-20 DIAGNOSIS — F339 Major depressive disorder, recurrent, unspecified: Secondary | ICD-10-CM | POA: Diagnosis not present

## 2023-08-20 DIAGNOSIS — F411 Generalized anxiety disorder: Secondary | ICD-10-CM | POA: Diagnosis not present

## 2023-08-25 DIAGNOSIS — F411 Generalized anxiety disorder: Secondary | ICD-10-CM | POA: Diagnosis not present

## 2023-08-25 DIAGNOSIS — F339 Major depressive disorder, recurrent, unspecified: Secondary | ICD-10-CM | POA: Diagnosis not present

## 2023-08-27 DIAGNOSIS — F411 Generalized anxiety disorder: Secondary | ICD-10-CM | POA: Diagnosis not present

## 2023-08-27 DIAGNOSIS — F339 Major depressive disorder, recurrent, unspecified: Secondary | ICD-10-CM | POA: Diagnosis not present

## 2023-09-01 DIAGNOSIS — F339 Major depressive disorder, recurrent, unspecified: Secondary | ICD-10-CM | POA: Diagnosis not present

## 2023-09-01 DIAGNOSIS — F411 Generalized anxiety disorder: Secondary | ICD-10-CM | POA: Diagnosis not present

## 2023-09-02 DIAGNOSIS — F411 Generalized anxiety disorder: Secondary | ICD-10-CM | POA: Diagnosis not present

## 2023-09-02 DIAGNOSIS — F339 Major depressive disorder, recurrent, unspecified: Secondary | ICD-10-CM | POA: Diagnosis not present

## 2023-09-03 DIAGNOSIS — F411 Generalized anxiety disorder: Secondary | ICD-10-CM | POA: Diagnosis not present

## 2023-09-03 DIAGNOSIS — F339 Major depressive disorder, recurrent, unspecified: Secondary | ICD-10-CM | POA: Diagnosis not present

## 2023-09-08 DIAGNOSIS — F339 Major depressive disorder, recurrent, unspecified: Secondary | ICD-10-CM | POA: Diagnosis not present

## 2023-09-08 DIAGNOSIS — F411 Generalized anxiety disorder: Secondary | ICD-10-CM | POA: Diagnosis not present

## 2023-09-09 DIAGNOSIS — F339 Major depressive disorder, recurrent, unspecified: Secondary | ICD-10-CM | POA: Diagnosis not present

## 2023-09-09 DIAGNOSIS — F411 Generalized anxiety disorder: Secondary | ICD-10-CM | POA: Diagnosis not present

## 2023-09-10 DIAGNOSIS — F411 Generalized anxiety disorder: Secondary | ICD-10-CM | POA: Diagnosis not present

## 2023-09-10 DIAGNOSIS — F339 Major depressive disorder, recurrent, unspecified: Secondary | ICD-10-CM | POA: Diagnosis not present

## 2023-09-15 DIAGNOSIS — F411 Generalized anxiety disorder: Secondary | ICD-10-CM | POA: Diagnosis not present

## 2023-09-15 DIAGNOSIS — F339 Major depressive disorder, recurrent, unspecified: Secondary | ICD-10-CM | POA: Diagnosis not present

## 2023-09-16 DIAGNOSIS — F339 Major depressive disorder, recurrent, unspecified: Secondary | ICD-10-CM | POA: Diagnosis not present

## 2023-09-16 DIAGNOSIS — F411 Generalized anxiety disorder: Secondary | ICD-10-CM | POA: Diagnosis not present

## 2023-09-17 DIAGNOSIS — F339 Major depressive disorder, recurrent, unspecified: Secondary | ICD-10-CM | POA: Diagnosis not present

## 2023-09-17 DIAGNOSIS — F411 Generalized anxiety disorder: Secondary | ICD-10-CM | POA: Diagnosis not present

## 2023-09-22 DIAGNOSIS — F339 Major depressive disorder, recurrent, unspecified: Secondary | ICD-10-CM | POA: Diagnosis not present

## 2023-09-22 DIAGNOSIS — F411 Generalized anxiety disorder: Secondary | ICD-10-CM | POA: Diagnosis not present

## 2023-09-24 DIAGNOSIS — F339 Major depressive disorder, recurrent, unspecified: Secondary | ICD-10-CM | POA: Diagnosis not present

## 2023-09-24 DIAGNOSIS — F411 Generalized anxiety disorder: Secondary | ICD-10-CM | POA: Diagnosis not present

## 2023-09-29 DIAGNOSIS — F411 Generalized anxiety disorder: Secondary | ICD-10-CM | POA: Diagnosis not present

## 2023-09-29 DIAGNOSIS — F339 Major depressive disorder, recurrent, unspecified: Secondary | ICD-10-CM | POA: Diagnosis not present

## 2023-10-01 DIAGNOSIS — F411 Generalized anxiety disorder: Secondary | ICD-10-CM | POA: Diagnosis not present

## 2023-10-01 DIAGNOSIS — F339 Major depressive disorder, recurrent, unspecified: Secondary | ICD-10-CM | POA: Diagnosis not present

## 2023-10-06 DIAGNOSIS — F411 Generalized anxiety disorder: Secondary | ICD-10-CM | POA: Diagnosis not present

## 2023-10-06 DIAGNOSIS — F339 Major depressive disorder, recurrent, unspecified: Secondary | ICD-10-CM | POA: Diagnosis not present

## 2023-10-07 DIAGNOSIS — F339 Major depressive disorder, recurrent, unspecified: Secondary | ICD-10-CM | POA: Diagnosis not present

## 2023-10-07 DIAGNOSIS — F411 Generalized anxiety disorder: Secondary | ICD-10-CM | POA: Diagnosis not present

## 2023-10-08 DIAGNOSIS — F411 Generalized anxiety disorder: Secondary | ICD-10-CM | POA: Diagnosis not present

## 2023-10-08 DIAGNOSIS — F339 Major depressive disorder, recurrent, unspecified: Secondary | ICD-10-CM | POA: Diagnosis not present

## 2023-10-13 DIAGNOSIS — F411 Generalized anxiety disorder: Secondary | ICD-10-CM | POA: Diagnosis not present

## 2023-10-13 DIAGNOSIS — F339 Major depressive disorder, recurrent, unspecified: Secondary | ICD-10-CM | POA: Diagnosis not present

## 2023-10-15 DIAGNOSIS — F411 Generalized anxiety disorder: Secondary | ICD-10-CM | POA: Diagnosis not present

## 2023-10-15 DIAGNOSIS — F339 Major depressive disorder, recurrent, unspecified: Secondary | ICD-10-CM | POA: Diagnosis not present

## 2023-10-20 DIAGNOSIS — F411 Generalized anxiety disorder: Secondary | ICD-10-CM | POA: Diagnosis not present

## 2023-10-20 DIAGNOSIS — F339 Major depressive disorder, recurrent, unspecified: Secondary | ICD-10-CM | POA: Diagnosis not present

## 2023-10-22 DIAGNOSIS — F411 Generalized anxiety disorder: Secondary | ICD-10-CM | POA: Diagnosis not present

## 2023-10-22 DIAGNOSIS — F339 Major depressive disorder, recurrent, unspecified: Secondary | ICD-10-CM | POA: Diagnosis not present

## 2023-10-27 DIAGNOSIS — F411 Generalized anxiety disorder: Secondary | ICD-10-CM | POA: Diagnosis not present

## 2023-10-27 DIAGNOSIS — F339 Major depressive disorder, recurrent, unspecified: Secondary | ICD-10-CM | POA: Diagnosis not present

## 2023-10-29 DIAGNOSIS — F339 Major depressive disorder, recurrent, unspecified: Secondary | ICD-10-CM | POA: Diagnosis not present

## 2023-10-29 DIAGNOSIS — F411 Generalized anxiety disorder: Secondary | ICD-10-CM | POA: Diagnosis not present

## 2023-11-03 DIAGNOSIS — F339 Major depressive disorder, recurrent, unspecified: Secondary | ICD-10-CM | POA: Diagnosis not present

## 2023-11-03 DIAGNOSIS — F411 Generalized anxiety disorder: Secondary | ICD-10-CM | POA: Diagnosis not present

## 2023-11-04 DIAGNOSIS — F411 Generalized anxiety disorder: Secondary | ICD-10-CM | POA: Diagnosis not present

## 2023-11-04 DIAGNOSIS — F339 Major depressive disorder, recurrent, unspecified: Secondary | ICD-10-CM | POA: Diagnosis not present

## 2023-11-05 DIAGNOSIS — F339 Major depressive disorder, recurrent, unspecified: Secondary | ICD-10-CM | POA: Diagnosis not present

## 2023-11-05 DIAGNOSIS — F411 Generalized anxiety disorder: Secondary | ICD-10-CM | POA: Diagnosis not present

## 2023-11-10 DIAGNOSIS — F411 Generalized anxiety disorder: Secondary | ICD-10-CM | POA: Diagnosis not present

## 2023-11-10 DIAGNOSIS — F339 Major depressive disorder, recurrent, unspecified: Secondary | ICD-10-CM | POA: Diagnosis not present

## 2023-11-12 DIAGNOSIS — F339 Major depressive disorder, recurrent, unspecified: Secondary | ICD-10-CM | POA: Diagnosis not present

## 2023-11-12 DIAGNOSIS — F411 Generalized anxiety disorder: Secondary | ICD-10-CM | POA: Diagnosis not present

## 2023-11-19 DIAGNOSIS — F411 Generalized anxiety disorder: Secondary | ICD-10-CM | POA: Diagnosis not present

## 2023-11-19 DIAGNOSIS — F339 Major depressive disorder, recurrent, unspecified: Secondary | ICD-10-CM | POA: Diagnosis not present

## 2023-11-24 DIAGNOSIS — F411 Generalized anxiety disorder: Secondary | ICD-10-CM | POA: Diagnosis not present

## 2023-11-24 DIAGNOSIS — F339 Major depressive disorder, recurrent, unspecified: Secondary | ICD-10-CM | POA: Diagnosis not present

## 2023-11-26 DIAGNOSIS — F411 Generalized anxiety disorder: Secondary | ICD-10-CM | POA: Diagnosis not present

## 2023-11-26 DIAGNOSIS — F339 Major depressive disorder, recurrent, unspecified: Secondary | ICD-10-CM | POA: Diagnosis not present

## 2023-12-02 DIAGNOSIS — F339 Major depressive disorder, recurrent, unspecified: Secondary | ICD-10-CM | POA: Diagnosis not present

## 2023-12-02 DIAGNOSIS — F411 Generalized anxiety disorder: Secondary | ICD-10-CM | POA: Diagnosis not present

## 2023-12-08 DIAGNOSIS — F339 Major depressive disorder, recurrent, unspecified: Secondary | ICD-10-CM | POA: Diagnosis not present

## 2023-12-08 DIAGNOSIS — F411 Generalized anxiety disorder: Secondary | ICD-10-CM | POA: Diagnosis not present

## 2023-12-09 DIAGNOSIS — F339 Major depressive disorder, recurrent, unspecified: Secondary | ICD-10-CM | POA: Diagnosis not present

## 2023-12-09 DIAGNOSIS — F411 Generalized anxiety disorder: Secondary | ICD-10-CM | POA: Diagnosis not present

## 2023-12-15 DIAGNOSIS — F339 Major depressive disorder, recurrent, unspecified: Secondary | ICD-10-CM | POA: Diagnosis not present

## 2023-12-15 DIAGNOSIS — F411 Generalized anxiety disorder: Secondary | ICD-10-CM | POA: Diagnosis not present

## 2023-12-17 DIAGNOSIS — F339 Major depressive disorder, recurrent, unspecified: Secondary | ICD-10-CM | POA: Diagnosis not present

## 2023-12-17 DIAGNOSIS — F411 Generalized anxiety disorder: Secondary | ICD-10-CM | POA: Diagnosis not present

## 2023-12-23 DIAGNOSIS — F339 Major depressive disorder, recurrent, unspecified: Secondary | ICD-10-CM | POA: Diagnosis not present

## 2023-12-23 DIAGNOSIS — F411 Generalized anxiety disorder: Secondary | ICD-10-CM | POA: Diagnosis not present

## 2023-12-29 DIAGNOSIS — F411 Generalized anxiety disorder: Secondary | ICD-10-CM | POA: Diagnosis not present

## 2023-12-29 DIAGNOSIS — F339 Major depressive disorder, recurrent, unspecified: Secondary | ICD-10-CM | POA: Diagnosis not present
